# Patient Record
Sex: Female | Born: 1935 | Race: White | Hispanic: No | Marital: Married | Smoking: Former smoker
Health system: Southern US, Community
[De-identification: ages and names within clinical notes are randomized; demographics above are authoritative.]

## PROBLEM LIST (undated history)

## (undated) DIAGNOSIS — I10 Essential (primary) hypertension: Secondary | ICD-10-CM

## (undated) DIAGNOSIS — Z8489 Family history of other specified conditions: Secondary | ICD-10-CM

## (undated) DIAGNOSIS — N8189 Other female genital prolapse: Secondary | ICD-10-CM

## (undated) DIAGNOSIS — R011 Cardiac murmur, unspecified: Secondary | ICD-10-CM

## (undated) DIAGNOSIS — K219 Gastro-esophageal reflux disease without esophagitis: Secondary | ICD-10-CM

## (undated) DIAGNOSIS — F419 Anxiety disorder, unspecified: Secondary | ICD-10-CM

## (undated) HISTORY — PX: ABDOMINAL HYSTERECTOMY: SHX81

## (undated) HISTORY — PX: BACK SURGERY: SHX140

## (undated) HISTORY — PX: FOOT SURGERY: SHX648

## (undated) HISTORY — PX: TONSILLECTOMY: SUR1361

---

## 1999-08-28 ENCOUNTER — Other Ambulatory Visit: Admission: RE | Admit: 1999-08-28 | Discharge: 1999-08-28 | Payer: Self-pay | Admitting: Gynecology

## 2000-02-27 ENCOUNTER — Encounter: Payer: Self-pay | Admitting: Gynecology

## 2000-02-27 ENCOUNTER — Encounter: Admission: RE | Admit: 2000-02-27 | Discharge: 2000-02-27 | Payer: Self-pay | Admitting: Gynecology

## 2000-08-29 ENCOUNTER — Other Ambulatory Visit: Admission: RE | Admit: 2000-08-29 | Discharge: 2000-08-29 | Payer: Self-pay | Admitting: Gynecology

## 2001-09-01 ENCOUNTER — Other Ambulatory Visit: Admission: RE | Admit: 2001-09-01 | Discharge: 2001-09-01 | Payer: Self-pay | Admitting: Gynecology

## 2002-03-26 ENCOUNTER — Encounter: Admission: RE | Admit: 2002-03-26 | Discharge: 2002-03-26 | Payer: Self-pay | Admitting: Gynecology

## 2002-03-26 ENCOUNTER — Encounter: Payer: Self-pay | Admitting: Gynecology

## 2002-11-16 ENCOUNTER — Other Ambulatory Visit: Admission: RE | Admit: 2002-11-16 | Discharge: 2002-11-16 | Payer: Self-pay | Admitting: Gynecology

## 2003-03-31 ENCOUNTER — Encounter: Admission: RE | Admit: 2003-03-31 | Discharge: 2003-03-31 | Payer: Self-pay | Admitting: Gynecology

## 2003-03-31 ENCOUNTER — Encounter: Payer: Self-pay | Admitting: Gynecology

## 2003-04-15 ENCOUNTER — Encounter: Admission: RE | Admit: 2003-04-15 | Discharge: 2003-04-15 | Payer: Self-pay | Admitting: Family Medicine

## 2003-04-15 ENCOUNTER — Encounter: Payer: Self-pay | Admitting: Family Medicine

## 2003-05-05 ENCOUNTER — Encounter: Admission: RE | Admit: 2003-05-05 | Discharge: 2003-06-14 | Payer: Self-pay | Admitting: Neurosurgery

## 2003-07-08 ENCOUNTER — Observation Stay (HOSPITAL_COMMUNITY): Admission: RE | Admit: 2003-07-08 | Discharge: 2003-07-09 | Payer: Self-pay | Admitting: Neurosurgery

## 2003-11-09 ENCOUNTER — Other Ambulatory Visit: Admission: RE | Admit: 2003-11-09 | Discharge: 2003-11-09 | Payer: Self-pay | Admitting: Gynecology

## 2004-05-15 ENCOUNTER — Encounter: Admission: RE | Admit: 2004-05-15 | Discharge: 2004-05-15 | Payer: Self-pay | Admitting: Gynecology

## 2004-09-14 ENCOUNTER — Inpatient Hospital Stay (HOSPITAL_COMMUNITY): Admission: RE | Admit: 2004-09-14 | Discharge: 2004-09-19 | Payer: Self-pay | Admitting: Neurosurgery

## 2004-11-21 ENCOUNTER — Other Ambulatory Visit: Admission: RE | Admit: 2004-11-21 | Discharge: 2004-11-21 | Payer: Self-pay | Admitting: Gynecology

## 2004-12-19 ENCOUNTER — Encounter: Admission: RE | Admit: 2004-12-19 | Discharge: 2004-12-27 | Payer: Self-pay | Admitting: Neurosurgery

## 2005-07-03 ENCOUNTER — Encounter: Admission: RE | Admit: 2005-07-03 | Discharge: 2005-07-03 | Payer: Self-pay | Admitting: Gynecology

## 2005-07-25 ENCOUNTER — Encounter: Admission: RE | Admit: 2005-07-25 | Discharge: 2005-07-25 | Payer: Self-pay | Admitting: Gynecology

## 2005-11-26 ENCOUNTER — Other Ambulatory Visit: Admission: RE | Admit: 2005-11-26 | Discharge: 2005-11-26 | Payer: Self-pay | Admitting: Gynecology

## 2006-07-17 ENCOUNTER — Encounter: Admission: RE | Admit: 2006-07-17 | Discharge: 2006-07-17 | Payer: Self-pay | Admitting: Gynecology

## 2006-07-24 ENCOUNTER — Encounter: Admission: RE | Admit: 2006-07-24 | Discharge: 2006-07-24 | Payer: Self-pay | Admitting: Gynecology

## 2006-12-11 ENCOUNTER — Other Ambulatory Visit: Admission: RE | Admit: 2006-12-11 | Discharge: 2006-12-11 | Payer: Self-pay | Admitting: Gynecology

## 2007-07-28 ENCOUNTER — Encounter: Admission: RE | Admit: 2007-07-28 | Discharge: 2007-07-28 | Payer: Self-pay | Admitting: Gynecology

## 2008-08-31 ENCOUNTER — Encounter: Admission: RE | Admit: 2008-08-31 | Discharge: 2008-08-31 | Payer: Self-pay | Admitting: Gynecology

## 2008-09-08 ENCOUNTER — Encounter: Admission: RE | Admit: 2008-09-08 | Discharge: 2008-09-08 | Payer: Self-pay | Admitting: Gynecology

## 2009-09-01 ENCOUNTER — Encounter: Admission: RE | Admit: 2009-09-01 | Discharge: 2009-09-01 | Payer: Self-pay | Admitting: Gynecology

## 2010-08-12 ENCOUNTER — Other Ambulatory Visit: Payer: Self-pay | Admitting: Gynecology

## 2010-08-12 DIAGNOSIS — Z1239 Encounter for other screening for malignant neoplasm of breast: Secondary | ICD-10-CM

## 2010-08-13 ENCOUNTER — Encounter: Payer: Self-pay | Admitting: Gynecology

## 2010-09-04 ENCOUNTER — Ambulatory Visit: Payer: Self-pay

## 2010-09-26 ENCOUNTER — Ambulatory Visit
Admission: RE | Admit: 2010-09-26 | Discharge: 2010-09-26 | Disposition: A | Discharge status: Home / Self Care | Payer: Medicare Other | Source: Ambulatory Visit | Attending: Gynecology | Admitting: Gynecology

## 2010-09-26 DIAGNOSIS — Z1239 Encounter for other screening for malignant neoplasm of breast: Secondary | ICD-10-CM

## 2010-11-16 ENCOUNTER — Other Ambulatory Visit: Payer: Self-pay | Admitting: Family Medicine

## 2010-11-16 DIAGNOSIS — M549 Dorsalgia, unspecified: Secondary | ICD-10-CM

## 2010-11-17 ENCOUNTER — Ambulatory Visit
Admission: RE | Admit: 2010-11-17 | Discharge: 2010-11-17 | Disposition: A | Discharge status: Home / Self Care | Payer: Medicare Other | Source: Ambulatory Visit | Attending: Family Medicine | Admitting: Family Medicine

## 2010-11-17 ENCOUNTER — Other Ambulatory Visit: Payer: Self-pay | Admitting: Family Medicine

## 2010-11-17 DIAGNOSIS — M549 Dorsalgia, unspecified: Secondary | ICD-10-CM

## 2010-11-17 DIAGNOSIS — M545 Low back pain, unspecified: Secondary | ICD-10-CM

## 2010-11-17 MED ORDER — GADOBENATE DIMEGLUMINE 529 MG/ML IV SOLN
15.0000 mL | Freq: Once | INTRAVENOUS | Status: AC | PRN
  Administered 2010-11-17: 15 mL via INTRAVENOUS

## 2010-12-08 NOTE — Discharge Summary (Signed)
[name], Misty Dodson                 ACCOUNT NO.:  [id number]   MEDICAL RECORD NO.:  [id number]          PATIENT TYPE:  INP   LOCATION:  3021                         FACILITY:  [facility]   PHYSICIAN:  Clydene Fake, M.D.  DATE OF BIRTH:  11/15/1935   DATE OF ADMISSION:  09/14/2004  DATE OF DISCHARGE:  09/19/2004                                 DISCHARGE SUMMARY   DIAGNOSES:  Lumbar spondylosis, spondylolisthesis, instability with stenosis  with prior surgery.   DISCHARGE DIAGNOSES:  Lumbar spondylosis, spondylolisthesis, instability  with stenosis with prior surgery.   PROCEDURES:  Decompression, L3, L4, L5 and S1 and posterior fusion at L3-4  and L4-5 interbody cages and superior pedicle screw fixation at L3 through  S1 posterolateral fusion.   REASON FOR ADMISSION:  The patient is a 75 year old woman with long history  of back pain and multiple previous __________ in the past.  She has had  worsening back pain and MRI and x-rays were done showing significant  spondylitic changes, spondylolisthesis, stenosis, and instability and  patient brought in for decompression procedure.   HOSPITAL COURSE:  The patient was admitted the day of surgery and underwent  procedure without complications.  Postoperatively, the patient was  transferred to the recovery room and then to the floor, doing fairly well  but had some significant back pain.  We did start to increase her activity.  PT/OT were consulted to assist with that.  Incision __________ for the first  day or so, and then that diminished.  She had mild ileus, but finally had a  change there and had a bowel movement by September 19, 2004, she was  ambulating well, decreasing incisional pain.  Incision was clean, dry and  intact.  On September 19, 2004, she is discharged home in stable condition.   DISCHARGE MEDICATIONS:  Same as hospitalization plus Percocet and Flexeril.   FOLLOWUP:  Follow up in two weeks my office.  No strenuous  activity, wear  brace.      JRH/MEDQ  D:  11/30/2004  T:  11/30/2004  Job:  [id number]

## 2010-12-08 NOTE — Op Note (Signed)
Misty Dodson, Misty Dodson                           ACCOUNT NO.:  [id number]   MEDICAL RECORD NO.:  [id number]                   PATIENT TYPE:  INP   LOCATION:  3013                                 FACILITY:  [facility]   PHYSICIAN:  Clydene Fake, M.D.               DATE OF BIRTH:  [birth year]   DATE OF PROCEDURE:  07/08/2003  DATE OF DISCHARGE:                                 OPERATIVE REPORT   PREOPERATIVE DIAGNOSIS:  Spondylosis, prior surgery, herniated nucleus  pulposus with stenosis and lateral recess stenosis and foraminal stenosis L5-  S1 with left sided L5 radiculopathy.   POSTOPERATIVE DIAGNOSIS:  Spondylosis, prior surgery, herniated nucleus  pulposus with stenosis and lateral recess stenosis and foraminal stenosis L5-  S1 with left sided L5 radiculopathy.   PROCEDURE:  Redo left L5-S1 decompressive laminectomy and extensive  foraminotomy, microdissection with the microscope.   SURGEON:  Clydene Fake, M.D.   ASSISTANT:  Hilda Lias, M.D.   ANESTHESIA:  General endotracheal anesthesia.   ESTIMATED BLOOD LOSS:  Minimal.   DRAINS:  None.   COMPLICATIONS:  None.   REASON FOR PROCEDURE:  The patient is a 75 year old woman who has been  having pain in her back and the left hip and leg with some numbness and  weakness of the left foot corresponding with the L5 root.  MRI was done  showing prior surgery but severe spondylosis at 4-5 and 5-1 with left 5-1  lateral recess stenosis, foraminal narrowing, and possible disc herniation.  The patient is brought in for decompression.   PROCEDURE IN DETAIL:  The patient was brought to the operating room, general  anesthesia was induced, the patient was placed in the prone position on the  Wilson frame with all pressure points padded.  The patient was prepped and  draped in the usual sterile fashion.  The incision was injected with 10 mL  of 1% lidocaine with epinephrine.  An incision was then made at the site of  the previous  scar in the lower lumbar spine, the incision was taken down to  the fascia, hemostasis was obtained with Bovie cauterization.  The fascia  was incised and subperiosteal dissection was done over the spinous  processes.  There was a lot of scar tissue.  A marker was placed and x-ray  was obtained and this was pointed to over the sacrum.  We dissected shortly  cephalad, again through significant scar tissue, and placed a marker there  and took another x-ray and showed this was the 5-1 interspace.  A self-  retaining retractor was placed.  We carefully dissected through scar tissue  and we were able to dissect it off the lamina above and below and remove  some and also peel it off the dura for a bit and remove it and we cut some  more with a knife  just thinning it, leaving some attached to  the dura.  This way, we could get into that space.  2 and 2.5 Kerrison punches were  then used to remove lamina of S1.  We started removing some of the facet and  removed some of the lamina of L5.  We continued dissecting through the scar,  we were able to find the S1 nerve root and the dura as we went cephalad.  A  large hypertrophic facet with the ligament was pushing into the dura.  A  high speed drill was used to start drilling off and performing a medial  facetectomy.  During all this, the microscope was brought in for  microdissection.  We then protected the dura and drilled down the bone  removing that with the Kerrison punches.  In this way, we went down and  found the foramen at 5-1, found some hypertrophic ligament and this was  removed.  We actually found the nerve root and then while protecting the  nerve root, we were able to continue removing hypertrophic facet and  ligament that was compressing the root.  We dissected up to the 5 pedicle  and decompressed the lateral recess and the 5 root.  Attention was then  taken back to the 5-1 disc space where most of the scar and some osteophytes  and  small disc protrusions and a piece of herniation that was there.  The  disc space was extremely tight.  We could not get a pituitary into it and  did not open it up anymore leaving it intact.  We had good decompression of  the S1 root and the L5 root.  The wound was irrigated with antibiotic  solution, retractors were removed, and the fascia was closed with 0 Vicryl  interrupted suture.  The subcutaneous tissue was closed with 2-0 and 3-0  Vicryl interrupted sutures.  The skin was closed with Benzoin and Steri-  Strips.  A dressing was placed.  The patient was placed back in the supine  position, awakened from anesthesia and transferred to the recovery room in  stable condition.                                               Clydene Fake, M.D.    JRH/MEDQ  D:  07/08/2003  T:  07/08/2003  Job:  [id number]

## 2010-12-08 NOTE — Op Note (Signed)
Misty Dodson, Misty Dodson                 ACCOUNT NO.:  [id number]   MEDICAL RECORD NO.:  [id number]          PATIENT TYPE:  INP   LOCATION:  2859                         FACILITY:  [facility]   PHYSICIAN:  Clydene Fake, M.D.  DATE OF BIRTH:  [birth year]   DATE OF PROCEDURE:  09/14/2004  DATE OF DISCHARGE:                                 OPERATIVE REPORT   PREOPERATIVE DIAGNOSIS:  Lumbar spondylosis, spondylolisthesis, instability,  and stenosis with prior surgery.   POSTOPERATIVE DIAGNOSIS:  Lumbar spondylosis, spondylolisthesis,  instability, and stenosis with prior surgery.   PROCEDURE:  Redo decompressive lumbar laminectomy L3, L4, L5, and S1;  posterior lumbar interbody fusion at L3-L4 and L4-L5, with Saber interbody  cages at L3-L4 and L4-L5, segmented pedicle screw fixation with Expedium  screws from L3 through S1, L3 through S1 posterolateral fusion, autograft  from same incision, allograft, Symphony system.   SURGEON:  Clydene Fake, M.D.   ASSISTANT:  Stefani Dama, M.D.   ANESTHESIA:  General endotracheal tube anesthesia.   ESTIMATED BLOOD LOSS:  600 mL.   BLOOD REPLACED:  2 units Cell Saver given.   DRAINS:  None.   COMPLICATIONS:  None.   INDICATIONS FOR PROCEDURE:  The patient is a 75 year old woman who has had a  long term history of back pain, she has had multiple previous decompressive  laminectomies which will last a few months and then states she has constant  back pain, worse with activity, and has been progressively worsening and  hampering her activities.  MRI and x-rays of the lumbar spine were evaluated  showing severe degenerative disc disease changes at L3-L4, L4-L5, and L5-S1  with spondylolisthesis at 3-4 that does not move between flexion and  extension, also with lateral recess stenosis and foraminal stenosis.  The  patient is brought in for decompression and fusion of the lumbar spine.   PROCEDURE IN DETAIL:  The patient was brought to the  operating room, general  anesthesia was induced, the patient was placed in the prone position on the  Wilson frame with all pressure points padded.  The patient was prepped and  draped in the usual sterile fashion.  The site of the incision was injected  with 20 mL of 1% lidocaine with epinephrine.  An incision was made at the  site of previous surgery in the midline lower lumbar spine and the incision  extended cephalad.  The incision was taken down to the fascia.  Hemostasis  was obtained with Bovie cauterization.  Subperiosteal dissection was done  out the spinous process of the lamina up to the facets and we exposed the  sacrum of L5, L4, L3, after imaging with fluoroscopy, which confirmed our  position, and exposed the L2 lamina so that we could then expose out  laterally the transverse process of L3, L4, L5, and lateral sacrum.  Self-  retaining retractors were placed after we exposed this.  The laminectomy was  then performed, Leksell rongeurs removing the L3, L4, L5, and S1 spinous  processes and dissecting down to the lamina.  Using [employer], [employer]  punches, and a high speed drill, we then continued the laminectomy.  There  was a significant scar on the left side at L4-L5 and L5-S1 and we had to  carefully dissect through.  We continued with this decompression.  At L3-L4,  there was significant facet hypertrophy with significant arthritic change  causing stenosis and facetectomy was performed and a laminectomy was  performed at the L3-L4 level and the L4-L5 levels.  We dissected out  laterally into the facets at 5-1, especially on the left side, to get her on  the scar, continued this decompression.  When we were finished, we had good  central decompression of the central canal, all the nerve roots were able to  be followed out their foramen and were decompressed from L3, L4, L5, and S1.  We explored the disc spaces at L5-S1 and we were able to get into the disc  space on the  left side after working through some of the scar and removing  some of the osteophytes.  On the right side, disc space appeared fused over,  it was very spondylitic and we were not able to get into the disc space.  No  interbody fusion was done there, we just made sure we had a good  decompression.  The L4-L5 disc space was found bilaterally, we carefully  worked through the scar on the left side and the disc space was incised and  discectomy performed with pituitary rongeurs and curets.  We used interbody  broaches to continue distracting the interspace up to 9 mm and then  preparing the interbody for fusion and cages using the various broaches.  We  used the curets to remove cartilaginous endplates and pituitary rongeurs to  remove the tissue.  Once we had a good discectomy performed, all the bone  that was removed during the laminectomy was cleaned from its soft tissue,  chopped up into small pieces and placed the Symphony system.  This bone was  then packed into two Saber interbody cages that were 9 height by 9 wide.  We  then packed this bone into the interbody space and while holding distraction  at one side, tapped the cage into the interspace on the opposite side being  very careful of the nerve roots and dura.  We removed the distraction and  then put the second cage in after packing the left side with the autograft  bone, also.  We then explored nerve roots and made sure we had decompression  and good position of the cages.  Attention was turned to 3-4 and, again,  discectomy was performed bilaterally, the disc space was distracted up to 11  mm and the interbody space was prepared with the broaches for interbody  fusion.  We used curets to use the cartilaginous endplates and packed two 11  high by 9 wide Saber cages with the autograft bone placed in the interspace and then tapped the cage on each side, again exploring the nerve roots  making sure we had good decompression and good  position of the cages.  Te  wound was irrigated with antibiotic solution and attention was turned to the  posterolateral gutters where a high speed drill was used to decorticate the  transverse processes and lateral facets of L3, L4, L5, and S1.  Under  fluoroscopic imaging, we found the pedicle entry points for L3, used the  high speed drill to decorticate and placed the pedicle probe down the  pedicle, tapped the pedicle,  used a small probe to  make sure we had good  bony circumference, and then placed a pedicle screw.  This process was  repeated first on the right side at L3, L4, L5, and S1, and then at all four  levels on the left side.  50 mm screws were placed at L3 and L4, 45 mm  screws were placed at L5, a 40 mm screw was placed on the right side at S1  and a 35 by 7 mm screw placed at the left S1.  All the other screws were 6  mm wide.  The wound was irrigated with antibiotic solution.  The autograft  Symphony mixture was mixed with the allograft Symphony mixture and all this  bone was packed in the posterolateral gutters from L3 to S1 for  posterolateral fusion.  Two rods were placed on the right and left and  locking nuts placed and then these were tightened down.  Final AP and  lateral fluoroscopic images were obtained showing good position of the  pedicle screws, rods, interbody cages, and good alignment of the spine.  The  retractors were removed.  Hemostasis was obtained with Bovie cauterization.  Gelfoam was placed over the dura and then the paraspinous muscles were  closed with 0 Vicryl interrupted sutures, the fascia was closed with 0  Vicryl interrupted sutures, the subcutaneous tissue closed with 0, 2-0 and 3-  0 Vicryl interrupted sutures, and the skin was closed with Benzoin and Steri-  Strips.  A dressing was placed.  The patient was placed back in the supine  position, awakened from anesthesia and transferred to the recovery room in  stable condition.    JRH/MEDQ   D:  09/14/2004  T:  09/14/2004  Job:  [id number]

## 2011-09-28 ENCOUNTER — Other Ambulatory Visit: Payer: Self-pay | Admitting: Gynecology

## 2011-09-28 DIAGNOSIS — Z1231 Encounter for screening mammogram for malignant neoplasm of breast: Secondary | ICD-10-CM

## 2011-10-11 ENCOUNTER — Ambulatory Visit
Admission: RE | Admit: 2011-10-11 | Discharge: 2011-10-11 | Disposition: A | Discharge status: Home / Self Care | Payer: Medicare Other | Source: Ambulatory Visit | Attending: Gynecology | Admitting: Gynecology

## 2011-10-11 DIAGNOSIS — Z1231 Encounter for screening mammogram for malignant neoplasm of breast: Secondary | ICD-10-CM

## 2012-02-21 ENCOUNTER — Encounter (HOSPITAL_COMMUNITY): Payer: Self-pay | Admitting: *Deleted

## 2012-02-21 ENCOUNTER — Emergency Department (HOSPITAL_COMMUNITY): Payer: Medicare Other

## 2012-02-21 ENCOUNTER — Emergency Department (HOSPITAL_COMMUNITY)
Admission: EM | Admit: 2012-02-21 | Discharge: 2012-02-21 | Disposition: A | Discharge status: Home / Self Care | Payer: Medicare Other | Attending: Emergency Medicine | Admitting: Emergency Medicine

## 2012-02-21 DIAGNOSIS — R0789 Other chest pain: Secondary | ICD-10-CM

## 2012-02-21 LAB — CBC
Hemoglobin: 14.4 g/dL (ref 12.0–15.0)
MCH: 32 pg (ref 26.0–34.0)
MCHC: 33.9 g/dL (ref 30.0–36.0)
MCV: 94.4 fL (ref 78.0–100.0)

## 2012-02-21 LAB — COMPREHENSIVE METABOLIC PANEL
ALT: 22 U/L (ref 0–35)
Calcium: 10 mg/dL (ref 8.4–10.5)
Creatinine, Ser: 0.66 mg/dL (ref 0.50–1.10)
GFR calc Af Amer: >90 mL/min (ref >90)
GFR calc non Af Amer: 86 mL/min — ABNORMAL LOW (ref >90)
Glucose, Bld: 93 mg/dL (ref 70–99)
Sodium: 143 mEq/L (ref 135–145)
Total Protein: 7 g/dL (ref 6.0–8.3)

## 2012-02-21 LAB — POCT I-STAT TROPONIN I
Troponin i, poc: 0 ng/mL (ref 0.00–0.08)
Troponin i, poc: 0 ng/mL (ref 0.00–0.08)

## 2012-02-21 NOTE — ED Notes (Signed)
Family at bedside.husband

## 2012-02-21 NOTE — ED Provider Notes (Signed)
History     CSN: [id number]  Arrival date & time 02/21/12  1344   First MD Initiated Contact with Patient 02/21/12 1437      Chief Complaint  Patient presents with  . Chest Pain    (Consider location/radiation/quality/duration/timing/severity/associated sxs/prior treatment) Patient is a 75 y.o. female presenting with chest pain. The history is provided by the patient.  Chest Pain The chest pain began less than 1 hour ago. Duration of episode(s) is 45 minutes. Chest pain occurs rarely. The chest pain is resolved. The severity of the pain is moderate. The quality of the pain is described as aching and pressure-like. Pertinent negatives for primary symptoms include no fever and no cough.  Pertinent negatives for associated symptoms include no diaphoresis. She tried aspirin for the symptoms. Risk factors include being elderly.   Patient had atypical chest pain (epigastric pain). Now pain free, she does have recent travel to [place] but denies SOB or pleuritic chest pain. No hx of DVT, PE.   History reviewed. No pertinent past medical history. HTN, back surgeries  History reviewed. No pertinent past surgical history.  History reviewed. No pertinent family history.  History  Substance Use Topics  . Smoking status: Not on file  . Smokeless tobacco: Not on file  . Alcohol Use: No    OB History    Grav Para Term Preterm Abortions TAB SAB Ect Mult Living                  Review of Systems  Constitutional: Negative for fever and diaphoresis.  Respiratory: Negative for cough.   Cardiovascular: Positive for chest pain.  All other systems reviewed and are negative.    Allergies  Review of patient's allergies indicates no known allergies.  Home Medications   Current Outpatient Rx  Name Route Sig Dispense Refill  . ACETAMINOPHEN 500 MG PO TABS Oral Take 500 mg by mouth every 6 (six) hours as needed. For pain/headache    . GABAPENTIN 300 MG PO CAPS Oral Take 300 mg by mouth 2  (two) times daily.    Marland Kitchen PROPRANOLOL HCL 10 MG PO TABS Oral Take 10 mg by mouth daily.      BP 140/77  Pulse 62  Resp 16  Ht 5\' 7"  (1.702 m)  Wt 130 lb (58.968 kg)  BMI 20.36 kg/m2  SpO2 95%  Physical Exam  Constitutional: She is oriented to person, place, and time. She appears well-developed.  Eyes: Conjunctivae and EOM are normal. Pupils are equal, round, and reactive to light.  Neck: Normal range of motion. Neck supple.  Cardiovascular: Normal rate, regular rhythm, normal heart sounds and intact distal pulses.   Pulmonary/Chest: Effort normal and breath sounds normal.  Abdominal: Soft. Bowel sounds are normal.  Neurological: She is alert and oriented to person, place, and time. She has normal reflexes.  Skin: Skin is warm.  Psychiatric: She has a normal mood and affect. Her behavior is normal. Judgment and thought content normal.    ED Course  Procedures (including critical care time)    Labs Reviewed  CBC  COMPREHENSIVE METABOLIC PANEL   No results found.   No diagnosis found.   Date: 02/21/2012  Rate: 64  Rhythm: normal sinus rhythm  QRS Axis: normal  Intervals: normal  ST/T Wave abnormalities: ST depression anteriorly  Conduction Disutrbances:none  Narrative Interpretation:   Old EKG Reviewed: changes noted     MDM  76 yo F with atypical chest pain that resolved. Will get  2 sets of troponins. Called CDU PA Abigail and will transfer to CDU if first set negative. If pain free and 2 sets negative, can be dc with f/u outpatient.         Richardean Canal, MD 02/21/12 [phone]

## 2012-02-21 NOTE — ED Notes (Signed)
Per EMS pt. Has acute onset of chest pain, dizziness, and nausea while riding in a car for 20 minutes.  Symptoms have completely subsided.  Pt. CBG was 125 and pt. Had no complaints of pain during EMS transport.

## 2012-02-21 NOTE — ED Provider Notes (Signed)
No pain on arrival to CDU. She is waiting on 2nd set of enzymes (POCTroponin) due at 7:00.   Second trop negative. Patient has no further pain. She can be discharged home.   Rodena Medin, PA-C 03/07/12 1433

## 2012-03-07 NOTE — ED Provider Notes (Signed)
Medical screening examination/treatment/procedure(s) were performed by non-physician practitioner and as supervising physician I was immediately available for consultation/collaboration.   Makayli Bracken, MD 03/07/12 1521 

## 2012-11-24 ENCOUNTER — Other Ambulatory Visit: Payer: Self-pay

## 2013-01-28 ENCOUNTER — Ambulatory Visit (HOSPITAL_COMMUNITY)
Admission: RE | Admit: 2013-01-28 | Discharge: 2013-01-28 | Disposition: A | Discharge status: Home / Self Care | Payer: Medicare Other | Source: Ambulatory Visit | Attending: Family Medicine | Admitting: Family Medicine

## 2013-01-28 ENCOUNTER — Other Ambulatory Visit (HOSPITAL_COMMUNITY): Payer: Self-pay | Admitting: Family Medicine

## 2013-01-28 DIAGNOSIS — M25561 Pain in right knee: Secondary | ICD-10-CM

## 2013-01-28 DIAGNOSIS — M79609 Pain in unspecified limb: Secondary | ICD-10-CM | POA: Insufficient documentation

## 2013-01-28 DIAGNOSIS — M7989 Other specified soft tissue disorders: Secondary | ICD-10-CM

## 2013-01-28 NOTE — Progress Notes (Signed)
VASCULAR LAB PRELIMINARY  PRELIMINARY  PRELIMINARY  PRELIMINARY  Right lower extremity venous duplex completed.    Preliminary report:  Right:  No evidence of DVT, superficial thrombosis, or Baker's cyst.  Jarmel Linhardt, RVS 01/28/2013, 7:02 PM

## 2013-02-05 ENCOUNTER — Ambulatory Visit: Payer: Medicare Other | Admitting: Women's Health

## 2013-03-26 ENCOUNTER — Other Ambulatory Visit: Payer: Self-pay

## 2013-03-26 DIAGNOSIS — Z1231 Encounter for screening mammogram for malignant neoplasm of breast: Secondary | ICD-10-CM

## 2013-04-22 ENCOUNTER — Ambulatory Visit
Admission: RE | Admit: 2013-04-22 | Discharge: 2013-04-22 | Disposition: A | Discharge status: Home / Self Care | Payer: Medicare Other | Source: Ambulatory Visit

## 2013-04-22 DIAGNOSIS — Z1231 Encounter for screening mammogram for malignant neoplasm of breast: Secondary | ICD-10-CM

## 2014-01-05 ENCOUNTER — Other Ambulatory Visit: Payer: Self-pay | Admitting: Urology

## 2014-02-08 ENCOUNTER — Encounter (HOSPITAL_COMMUNITY): Payer: Self-pay | Admitting: Pharmacy Technician

## 2014-02-12 ENCOUNTER — Encounter (HOSPITAL_COMMUNITY)
Admission: RE | Admit: 2014-02-12 | Discharge: 2014-02-12 | Disposition: A | Discharge status: Home / Self Care | Payer: Medicare Other | Source: Ambulatory Visit | Attending: Obstetrics and Gynecology | Admitting: Obstetrics and Gynecology

## 2014-02-12 ENCOUNTER — Encounter (HOSPITAL_COMMUNITY): Payer: Self-pay

## 2014-02-12 DIAGNOSIS — Z01812 Encounter for preprocedural laboratory examination: Secondary | ICD-10-CM | POA: Diagnosis present

## 2014-02-12 DIAGNOSIS — Z01818 Encounter for other preprocedural examination: Secondary | ICD-10-CM | POA: Diagnosis not present

## 2014-02-12 HISTORY — DX: Essential (primary) hypertension: I10

## 2014-02-12 HISTORY — DX: Anxiety disorder, unspecified: F41.9

## 2014-02-12 HISTORY — DX: Family history of other specified conditions: Z84.89

## 2014-02-12 HISTORY — DX: Cardiac murmur, unspecified: R01.1

## 2014-02-12 HISTORY — DX: Gastro-esophageal reflux disease without esophagitis: K21.9

## 2014-02-12 LAB — TYPE AND SCREEN
ABO/RH(D): A POS
Antibody Screen: NEGATIVE

## 2014-02-12 LAB — ABO/RH: ABO/RH(D): A POS

## 2014-02-12 LAB — APTT: APTT: 24 s (ref 24–37)

## 2014-02-12 LAB — CBC
HCT: 40.9 % (ref 36.0–46.0)
HEMOGLOBIN: 13.9 g/dL (ref 12.0–15.0)
MCH: 32.5 pg (ref 26.0–34.0)
MCHC: 34 g/dL (ref 30.0–36.0)
MCV: 95.6 fL (ref 78.0–100.0)
Platelets: 161 10*3/uL (ref 150–400)
RBC: 4.28 MIL/uL (ref 3.87–5.11)
RDW: 13 % (ref 11.5–15.5)
WBC: 5.3 10*3/uL (ref 4.0–10.5)

## 2014-02-12 LAB — PROTIME-INR
INR: 0.94 (ref 0.00–1.49)
Prothrombin Time: 12.6 seconds (ref 11.6–15.2)

## 2014-02-12 NOTE — Pre-Procedure Instructions (Signed)
Previous EKG and today's (02/12/14) reviewed by Dr Rodman Pickleassidy. No orders given.

## 2014-02-12 NOTE — Patient Instructions (Signed)
[address] [name]  02/12/2014   Your procedure is scheduled on:  02/23/14  Enter through the Main Entrance of [facility] at 6 AM.  Pick up the phone at the desk and dial [phone].   Call this number if you have problems the morning of surgery: [phone]   Remember:   Do not eat food:After Midnight.  Do not drink clear liquids: After Midnight.  Take these medicines the morning of surgery with A SIP OF WATER: Inderal, Nexium   Do not wear jewelry, make-up or nail polish.  Do not wear lotions, powders, or perfumes. You may wear deodorant.  Do not shave 48 hours prior to surgery.  Do not bring valuables to the hospital.  [facility] is not   responsible for any belongings or valuables brought to the hospital.  Contacts, dentures or bridgework may not be worn into surgery.  Leave suitcase in the car. After surgery it may be brought to your room.  For patients admitted to the hospital, checkout time is 11:00 AM the day of              discharge.   Patients discharged the day of surgery will not be allowed to drive             home.  Name and phone number of your driver: NA  Special Instructions:      Please read over the following fact sheets that you were given:   Surgical Site Infection Prevention

## 2014-02-22 ENCOUNTER — Encounter (HOSPITAL_COMMUNITY): Payer: Self-pay | Admitting: Obstetrics and Gynecology

## 2014-02-22 DIAGNOSIS — N8189 Other female genital prolapse: Secondary | ICD-10-CM

## 2014-02-22 HISTORY — DX: Other female genital prolapse: N81.89

## 2014-02-22 MED ORDER — PHENAZOPYRIDINE HCL 200 MG PO TABS
200.0000 mg | ORAL_TABLET | Freq: Once | ORAL | Status: AC
  Administered 2014-02-23: 200 mg via ORAL
  Filled 2014-02-22: qty 1

## 2014-02-22 MED ORDER — PHENAZOPYRIDINE HCL 200 MG PO TABS
200.0000 mg | ORAL_TABLET | Freq: Once | ORAL | Status: DC
  Filled 2014-02-22: qty 1

## 2014-02-22 NOTE — H&P (Signed)
Misty Dodson is an 78 y.o. female G3P3 with pelvic floor relaxation and some SUI.  Pt states more pressure as day goes on or with increased activity.  Rarely sexually active.  When tissue is relaxed, vaginal tissue is irritated.    Pertinent Gynecological History: Menopause (TVH 1990's) DES exposure: unknown  Sexually transmitted diseases: no past history Previous GYN Procedures: TVH  No abnormal pap OB History: G3, P3003, G3 breech 9#5; TSVD x3  Menstrual History: No LMP recorded. Patient is postmenopausal.    Past Medical History  Diagnosis Date  . Family history of anesthesia complication     son's "heart stopped" during knee repair age [age]Yrs  . GERD (gastroesophageal reflux disease)   . Hypertension   . Heart murmur   . Anxiety     occasional "panic attacks"  . 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  migraines, back pain  Past Surgical History  Procedure Laterality Date  . Abdominal hysterectomy    . Tonsillectomy    . Foot surgery    . Back surgery      "several times"    FH: seizure disorder  Social History:  reports that she has quit smoking. She does not have any smokeless tobacco history on file. She reports that she drinks alcohol. She reports that she does not use illicit drugs.married, retired - travels!  Allergies: No Known Allergies    Celebrex, tylenol, nexium, melatonin, neurontin, premarin cream, propranolol, ambien, zyrtec  Review of Systems  Constitutional: Negative.   Eyes: Negative.   Respiratory: Negative.   Cardiovascular: Negative.   Gastrointestinal: Negative.   Genitourinary:       Incontinence  Musculoskeletal: Negative.   Skin: Negative.   Neurological: Negative.   Psychiatric/Behavioral: Negative.     There were no vitals taken for this visit. Physical Exam  Constitutional: She is oriented to person, place, and time. She appears well-developed and well-nourished.  HENT:  Head: Normocephalic and atraumatic.  Cardiovascular: Normal  rate and regular rhythm.   Respiratory: Effort normal and breath sounds normal. No respiratory distress. She has no wheezes.  GI: Soft. Bowel sounds are normal. She exhibits no distension. There is no tenderness.  Musculoskeletal: Normal range of motion.  Neurological: She is alert and oriented to person, place, and time.  Skin: Skin is warm and dry.  Psychiatric: She has a normal mood and affect. Her behavior is normal.    No results found for this or any previous visit (from the past 24 hour(s)).  No results found.  Assessment/Plan: 78yo G3P3 with pelvic relaxation and SUI for A&P repair and sling, with urology D/w pt r/b/a wishes to proceed Ancef for prophylaxis PCA for pain postoperatively  [name], Elsia [place] 02/22/2014, 4:32 PM

## 2014-02-22 NOTE — H&P (Signed)
History of Present Illness   Misty Dodson normally does not wear a pad but can leak with exercise but not with coughing, sneezing, or with urgency. She does splinting for urination as noted. Dr Ellyn Hack is planning and A&P repair and discussion regarding a pessary had been discussed in the past. She has a grade 3 cystocele with central defect and her vaginal cuff descends 4 or 5 cm. She had a grade 2 rectocele. She had a negative cough test with the cystocele reduced. We called Dr Emeline Darling office and clarified that she is planning to do the A&P repair as noted in her medical record and this was to be clarified on this visit to Misty Rohrman  Importantly, when she did have a pessary, she was wearing 2 thick pads a day which were quite wet with walking.  She did not void and was catheterized for 275 mL. Her initial residual was 0.0 mL. Her maximum capacity is 395 mL. Her bladder was somewhat hypersensitive. Her bladder was stable. Because of Vagovagal response, she was not filled to a greater amount. She did not leak with a Valsalva pressure of 132 cmH2O and this is reproducible. This is with and without the prolapse reduced. During voluntary voiding, she voided 100 mL with a maximum flow of 7 mL/sec. Maximum voiding pressure was approximately 23 cmH2O. Residual was 300 mL. EMG activity increased during the voiding phase as noted. She had an interrupted flow pattern. She has spinal hardware as noted with a moderate cystocele fluoroscopically Bladder neck descended approximately 2 cm. Bladder was trabeculated. The details of the urodynamics are signed and dictated on the urodynamic sheet.   Review of Systems: No change in bowel or neurologic systems.    Past Medical History Problems  1. History of High cholesterol (272.0) 2. History of arthritis (V13.4) 3. History of esophageal reflux (V12.79)  Surgical History Problems  1. History of Back Surgery 2. History of Hysterectomy 3. History of  Tonsillectomy  Current Meds 1. CeleBREX 200 MG Oral Capsule;  Therapy: (Recorded:10Jun2015) to Recorded 2. Ciprofloxacin HCl - 250 MG Oral Tablet; Take 1 tablet twice daily;  Therapy: 10Jun2015 to (Evaluate:17Jun2015); Last Rx:10Jun2015 Ordered 3. Gabapentin 300 MG TABS;  Therapy: (Recorded:10Jun2015) to Recorded 4. NexIUM 20 MG Oral Capsule Delayed Release;  Therapy: (Recorded:10Jun2015) to Recorded 5. Oxycodone-Acetaminophen 5-325 MG Oral Tablet;  Therapy: (Recorded:10Jun2015) to Recorded 6. Tylenol 500 MG CAPS;  Therapy: (Recorded:10Jun2015) to Recorded 7. Zyrtec 10 MG TABS;  Therapy: (Recorded:10Jun2015) to Recorded  Allergies Medication  1. No Known Drug Allergies  Family History Problems  1. Family history of Medical history non-contributory  Social History Problems  1. Alcohol use (V49.89)   1 per day 2. Former smoker (V15.82)   1 pack a day for 15 years, quit date not provided only stated "many". 3. Married 4. Retired  Assessment Assessed  1. Cystocele, midline (618.01) 2. Rectocele, female (618.04) 3. Female stress incontinence (625.6)  Discussion/Summary   A picture was drawn. Because she can leak with exercise, she likely has a mild outlet abnormality. She also reports leaking with walking with a pessary in place, so one can have a false positive ____ with a pessary.  I talked about pelvic floor exercises versus watchful waiting versus physical therapy versus a sling.   We talked about a sling in detail. Pros, cons, general surgical and anesthetic risks, and other options including behavioral therapy and watchful waiting were discussed. She understands that slings are generally successful in 90% of cases  for stress incontinence, 50% for urge incontinence, and that in a small percentage of cases the incontinence can worsen. The risk of persistent, de novo, or worsening incontinence/dysfunction was discussed. Risks were described but not limited to the  discussion of injury to neighboring structures including the bowel (with possible life-threatening sepsis and colostomy), bladder, urethra, vagina (all resulting in further surgery), and ureter (resulting in re-implantation). We also talked about the risk of retention requiring urethrolysis, extrusion requiring revision, and erosion resulting in further surgery. Bleeding risks and transfusion rates and the risk of infection were discussed. The risk of pelvic and abdominal pain syndromes, dyspareunia, and neuropathies were discussed. The need for CIC was described as well as the usual postoperative course. The patient understands that she might not reach her treatment goal and that she might be worse following surgery. Mesh TV issues were discussed.  I felt that watchful waiting was something to strongly consider.   Sling issues were discussed on TV.   Mr and Mrs Shellee MiloDrag and I did talk a lot about watchful waiting versus sling. Misty Shellee MiloDrag said she really wants everything done at the same time and I think she made a good decision for her to have a sling. She asked me what I would do and I relayed this, which was a more conservative approach.   She will call Dr Ellyn HackBovard. I will wait to hear from them. We will proceed accordingly with a sling. Bladder lift was discussed.  After a thorough review of the management options for the patient's condition the patient  elected to proceed with surgical therapy as noted above. We have discussed the potential benefits and risks of the procedure, side effects of the proposed treatment, the likelihood of the patient achieving the goals of the procedure, and any potential problems that might occur during the procedure or recuperation. Informed consent has been obtained.

## 2014-02-23 ENCOUNTER — Encounter (HOSPITAL_COMMUNITY): Payer: Self-pay | Admitting: Anesthesiology

## 2014-02-23 ENCOUNTER — Encounter (HOSPITAL_COMMUNITY)
Admission: RE | Disposition: A | Discharge status: Home / Self Care | Payer: Self-pay | Source: Ambulatory Visit | Attending: Obstetrics and Gynecology

## 2014-02-23 ENCOUNTER — Observation Stay (HOSPITAL_COMMUNITY)
Admission: RE | Admit: 2014-02-23 | Discharge: 2014-02-24 | Disposition: A | Discharge status: Home / Self Care | Payer: Medicare Other | Source: Ambulatory Visit | Attending: Obstetrics and Gynecology | Admitting: Obstetrics and Gynecology

## 2014-02-23 ENCOUNTER — Ambulatory Visit (HOSPITAL_COMMUNITY): Payer: Medicare Other | Admitting: Anesthesiology

## 2014-02-23 ENCOUNTER — Encounter (HOSPITAL_COMMUNITY): Payer: Medicare Other | Admitting: Anesthesiology

## 2014-02-23 DIAGNOSIS — Z87891 Personal history of nicotine dependence: Secondary | ICD-10-CM | POA: Diagnosis not present

## 2014-02-23 DIAGNOSIS — R011 Cardiac murmur, unspecified: Secondary | ICD-10-CM | POA: Diagnosis not present

## 2014-02-23 DIAGNOSIS — N393 Stress incontinence (female) (male): Secondary | ICD-10-CM | POA: Diagnosis not present

## 2014-02-23 DIAGNOSIS — I10 Essential (primary) hypertension: Secondary | ICD-10-CM | POA: Insufficient documentation

## 2014-02-23 DIAGNOSIS — K219 Gastro-esophageal reflux disease without esophagitis: Secondary | ICD-10-CM | POA: Insufficient documentation

## 2014-02-23 DIAGNOSIS — N993 Prolapse of vaginal vault after hysterectomy: Secondary | ICD-10-CM | POA: Diagnosis present

## 2014-02-23 DIAGNOSIS — F411 Generalized anxiety disorder: Secondary | ICD-10-CM | POA: Insufficient documentation

## 2014-02-23 DIAGNOSIS — N8189 Other female genital prolapse: Secondary | ICD-10-CM | POA: Diagnosis present

## 2014-02-23 HISTORY — DX: Other female genital prolapse: N81.89

## 2014-02-23 HISTORY — PX: ANTERIOR AND POSTERIOR REPAIR: SHX5121

## 2014-02-23 HISTORY — PX: PUBOVAGINAL SLING: SHX1035

## 2014-02-23 HISTORY — PX: CYSTOSCOPY: SHX5120

## 2014-02-23 LAB — TYPE AND SCREEN
ABO/RH(D): A POS
Antibody Screen: NEGATIVE

## 2014-02-23 LAB — COMPREHENSIVE METABOLIC PANEL
ALBUMIN: 3.8 g/dL (ref 3.5–5.2)
ALT: 20 U/L (ref 0–35)
AST: 24 U/L (ref 0–37)
Alkaline Phosphatase: 66 U/L (ref 39–117)
Anion gap: 11 (ref 5–15)
BILIRUBIN TOTAL: 0.2 mg/dL — AB (ref 0.3–1.2)
BUN: 24 mg/dL — ABNORMAL HIGH (ref 6–23)
CHLORIDE: 103 meq/L (ref 96–112)
CO2: 26 mEq/L (ref 19–32)
CREATININE: 0.67 mg/dL (ref 0.50–1.10)
Calcium: 9.3 mg/dL (ref 8.4–10.5)
GFR calc Af Amer: >90 mL/min (ref >90)
GFR calc non Af Amer: 84 mL/min — ABNORMAL LOW (ref >90)
Glucose, Bld: 106 mg/dL — ABNORMAL HIGH (ref 70–99)
Potassium: 3.9 mEq/L (ref 3.7–5.3)
Sodium: 140 mEq/L (ref 137–147)
TOTAL PROTEIN: 6.7 g/dL (ref 6.0–8.3)

## 2014-02-23 SURGERY — ANTERIOR (CYSTOCELE) AND POSTERIOR REPAIR (RECTOCELE)
Anesthesia: Monitor Anesthesia Care | Site: Vagina

## 2014-02-23 MED ORDER — LIDOCAINE HCL (CARDIAC) 20 MG/ML IV SOLN
INTRAVENOUS | Status: AC
  Filled 2014-02-23: qty 5

## 2014-02-23 MED ORDER — MIDAZOLAM HCL 2 MG/2ML IJ SOLN: 0.5000 mg | Freq: Once | INTRAMUSCULAR | Status: DC | PRN

## 2014-02-23 MED ORDER — CEFAZOLIN SODIUM-DEXTROSE 2-3 GM-% IV SOLR
INTRAVENOUS | Status: AC
  Filled 2014-02-23: qty 50

## 2014-02-23 MED ORDER — GUAIFENESIN 100 MG/5ML PO SOLN: 15.0000 mL | ORAL | Status: DC | PRN

## 2014-02-23 MED ORDER — FENTANYL CITRATE 0.05 MG/ML IJ SOLN
INTRAMUSCULAR | Status: AC
  Filled 2014-02-23: qty 2

## 2014-02-23 MED ORDER — LACTATED RINGERS IV SOLN
INTRAVENOUS | Status: DC
  Administered 2014-02-23 – 2014-02-24 (×3): via INTRAVENOUS

## 2014-02-23 MED ORDER — HYDROMORPHONE 0.3 MG/ML IV SOLN
INTRAVENOUS | Status: DC
  Administered 2014-02-23: 12:00:00 via INTRAVENOUS
  Administered 2014-02-23: 0.799 mg via INTRAVENOUS
  Filled 2014-02-23: qty 25

## 2014-02-23 MED ORDER — OXYCODONE-ACETAMINOPHEN 5-325 MG PO TABS
1.0000 | ORAL_TABLET | ORAL | Status: DC | PRN
  Administered 2014-02-23 (×2): 1 via ORAL
  Administered 2014-02-23 – 2014-02-24 (×3): 2 via ORAL
  Filled 2014-02-23 (×3): qty 2
  Filled 2014-02-23 (×2): qty 1

## 2014-02-23 MED ORDER — MEPERIDINE HCL 25 MG/ML IJ SOLN: 6.2500 mg | INTRAMUSCULAR | Status: DC | PRN

## 2014-02-23 MED ORDER — PROMETHAZINE HCL 25 MG/ML IJ SOLN: 6.2500 mg | INTRAMUSCULAR | Status: DC | PRN

## 2014-02-23 MED ORDER — ONDANSETRON HCL 4 MG/2ML IJ SOLN
INTRAMUSCULAR | Status: AC
  Filled 2014-02-23: qty 2

## 2014-02-23 MED ORDER — PANTOPRAZOLE SODIUM 40 MG PO TBEC: 40.0000 mg | DELAYED_RELEASE_TABLET | Freq: Every day | ORAL | Status: DC

## 2014-02-23 MED ORDER — STERILE WATER FOR IRRIGATION IR SOLN
Status: DC | PRN
  Administered 2014-02-23: 1000 mL

## 2014-02-23 MED ORDER — DEXAMETHASONE SODIUM PHOSPHATE 10 MG/ML IJ SOLN
INTRAMUSCULAR | Status: AC
  Filled 2014-02-23: qty 1

## 2014-02-23 MED ORDER — GABAPENTIN 300 MG PO CAPS
300.0000 mg | ORAL_CAPSULE | Freq: Two times a day (BID) | ORAL | Status: DC
  Administered 2014-02-23: 300 mg via ORAL
  Filled 2014-02-23 (×3): qty 1

## 2014-02-23 MED ORDER — CELECOXIB 200 MG PO CAPS
200.0000 mg | ORAL_CAPSULE | Freq: Every day | ORAL | Status: DC
  Administered 2014-02-24: 200 mg via ORAL
  Filled 2014-02-23 (×2): qty 1

## 2014-02-23 MED ORDER — KETOROLAC TROMETHAMINE 30 MG/ML IJ SOLN: 15.0000 mg | Freq: Once | INTRAMUSCULAR | Status: DC | PRN

## 2014-02-23 MED ORDER — KETOROLAC TROMETHAMINE 30 MG/ML IJ SOLN
INTRAMUSCULAR | Status: AC
  Filled 2014-02-23: qty 1

## 2014-02-23 MED ORDER — PROPOFOL 10 MG/ML IV EMUL
INTRAVENOUS | Status: AC
  Filled 2014-02-23: qty 20

## 2014-02-23 MED ORDER — ESTRADIOL 0.1 MG/GM VA CREA
TOPICAL_CREAM | VAGINAL | Status: DC | PRN
  Administered 2014-02-23: 1 via VAGINAL

## 2014-02-23 MED ORDER — BUPIVACAINE HCL (PF) 0.25 % IJ SOLN
INTRAMUSCULAR | Status: AC
  Filled 2014-02-23: qty 30

## 2014-02-23 MED ORDER — SODIUM CHLORIDE 0.9 % IR SOLN
Freq: Once | Status: AC
  Administered 2014-02-23: 10:00:00
  Filled 2014-02-23: qty 1

## 2014-02-23 MED ORDER — SODIUM CHLORIDE 0.9 % IJ SOLN: 9.0000 mL | INTRAMUSCULAR | Status: DC | PRN

## 2014-02-23 MED ORDER — PROPRANOLOL HCL 10 MG PO TABS
10.0000 mg | ORAL_TABLET | Freq: Every day | ORAL | Status: DC
  Administered 2014-02-24: 10 mg via ORAL
  Filled 2014-02-23: qty 1

## 2014-02-23 MED ORDER — IBUPROFEN 800 MG PO TABS: 800.0000 mg | ORAL_TABLET | Freq: Three times a day (TID) | ORAL | Status: DC | PRN

## 2014-02-23 MED ORDER — MIDAZOLAM HCL 2 MG/2ML IJ SOLN
INTRAMUSCULAR | Status: AC
  Filled 2014-02-23: qty 2

## 2014-02-23 MED ORDER — LIDOCAINE-EPINEPHRINE (PF) 1 %-1:200000 IJ SOLN
INTRAMUSCULAR | Status: DC | PRN
  Administered 2014-02-23: 30 mL

## 2014-02-23 MED ORDER — LIDOCAINE-EPINEPHRINE (PF) 1 %-1:200000 IJ SOLN
INTRAMUSCULAR | Status: AC
  Filled 2014-02-23: qty 10

## 2014-02-23 MED ORDER — CEFAZOLIN SODIUM-DEXTROSE 2-3 GM-% IV SOLR
2.0000 g | INTRAVENOUS | Status: AC
  Administered 2014-02-23: 2 g via INTRAVENOUS

## 2014-02-23 MED ORDER — ZOLPIDEM TARTRATE 5 MG PO TABS
5.0000 mg | ORAL_TABLET | Freq: Every evening | ORAL | Status: DC | PRN
  Administered 2014-02-23: 5 mg via ORAL
  Filled 2014-02-23: qty 1

## 2014-02-23 MED ORDER — ONDANSETRON HCL 4 MG/2ML IJ SOLN
INTRAMUSCULAR | Status: DC | PRN
  Administered 2014-02-23: 4 mg via INTRAVENOUS

## 2014-02-23 MED ORDER — 0.9 % SODIUM CHLORIDE (POUR BTL) OPTIME
TOPICAL | Status: DC | PRN
  Administered 2014-02-23: 1000 mL

## 2014-02-23 MED ORDER — LIDOCAINE HCL (CARDIAC) 20 MG/ML IV SOLN
INTRAVENOUS | Status: DC | PRN
  Administered 2014-02-23 (×3): 20 mg via INTRAVENOUS

## 2014-02-23 MED ORDER — LACTATED RINGERS IV SOLN
INTRAVENOUS | Status: DC
  Administered 2014-02-23: 07:00:00 via INTRAVENOUS

## 2014-02-23 MED ORDER — SIMETHICONE 80 MG PO CHEW: 80.0000 mg | CHEWABLE_TABLET | Freq: Four times a day (QID) | ORAL | Status: DC | PRN

## 2014-02-23 MED ORDER — VASOPRESSIN 20 UNIT/ML IJ SOLN
INTRAMUSCULAR | Status: AC
  Filled 2014-02-23: qty 1

## 2014-02-23 MED ORDER — ESTRADIOL 0.1 MG/GM VA CREA
TOPICAL_CREAM | VAGINAL | Status: AC
  Filled 2014-02-23: qty 42.5

## 2014-02-23 MED ORDER — ZOLPIDEM TARTRATE 5 MG PO TABS: 5.0000 mg | ORAL_TABLET | Freq: Every evening | ORAL | Status: DC | PRN

## 2014-02-23 MED ORDER — FENTANYL CITRATE 0.05 MG/ML IJ SOLN
INTRAMUSCULAR | Status: DC | PRN
  Administered 2014-02-23: 25 ug via INTRATHECAL
  Administered 2014-02-23 (×3): 25 ug via INTRAVENOUS

## 2014-02-23 MED ORDER — FENTANYL CITRATE 0.05 MG/ML IJ SOLN: 25.0000 ug | INTRAMUSCULAR | Status: DC | PRN

## 2014-02-23 MED ORDER — PROPOFOL INFUSION 10 MG/ML OPTIME
INTRAVENOUS | Status: DC | PRN
  Administered 2014-02-23: 25 ug/kg/min via INTRAVENOUS

## 2014-02-23 MED ORDER — CIPROFLOXACIN IN D5W 400 MG/200ML IV SOLN
400.0000 mg | Freq: Once | INTRAVENOUS | Status: AC
  Administered 2014-02-23: 400 mg via INTRAVENOUS
  Filled 2014-02-23: qty 200

## 2014-02-23 MED ORDER — ALUM & MAG HYDROXIDE-SIMETH 200-200-20 MG/5ML PO SUSP: 30.0000 mL | ORAL | Status: DC | PRN

## 2014-02-23 MED ORDER — BUPIVACAINE IN DEXTROSE 0.75-8.25 % IT SOLN
INTRATHECAL | Status: DC | PRN
  Administered 2014-02-23: 1.8 mL via INTRATHECAL

## 2014-02-23 MED ORDER — PROPOFOL 10 MG/ML IV EMUL
INTRAVENOUS | Status: AC
  Filled 2014-02-23: qty 40

## 2014-02-23 MED ORDER — ONDANSETRON HCL 4 MG/2ML IJ SOLN: 4.0000 mg | Freq: Four times a day (QID) | INTRAMUSCULAR | Status: DC | PRN

## 2014-02-23 MED ORDER — PROPOFOL 10 MG/ML IV BOLUS
INTRAVENOUS | Status: DC | PRN
  Administered 2014-02-23: 30 mg via INTRAVENOUS
  Administered 2014-02-23: 20 mg via INTRAVENOUS
  Administered 2014-02-23 (×2): 30 mg via INTRAVENOUS
  Administered 2014-02-23: 20 mg via INTRAVENOUS
  Administered 2014-02-23: 30 mg via INTRAVENOUS
  Administered 2014-02-23: 10 mg via INTRAVENOUS
  Administered 2014-02-23 (×2): 20 mg via INTRAVENOUS

## 2014-02-23 MED ORDER — PHENYLEPHRINE HCL 10 MG/ML IJ SOLN
INTRAMUSCULAR | Status: DC | PRN
  Administered 2014-02-23 (×4): 40 ug via INTRAVENOUS

## 2014-02-23 MED ORDER — VASOPRESSIN 20 UNIT/ML IJ SOLN
INTRAMUSCULAR | Status: DC | PRN
Start: 2014-02-23 — End: 2014-02-23
  Administered 2014-02-23: 20 [IU]

## 2014-02-23 MED ORDER — NALOXONE HCL 0.4 MG/ML IJ SOLN: 0.4000 mg | INTRAMUSCULAR | Status: DC | PRN

## 2014-02-23 MED ORDER — MENTHOL 3 MG MT LOZG: 1.0000 | LOZENGE | OROMUCOSAL | Status: DC | PRN

## 2014-02-23 MED ORDER — DIPHENHYDRAMINE HCL 50 MG/ML IJ SOLN: 12.5000 mg | Freq: Four times a day (QID) | INTRAMUSCULAR | Status: DC | PRN

## 2014-02-23 MED ORDER — DEXAMETHASONE SODIUM PHOSPHATE 10 MG/ML IJ SOLN
INTRAMUSCULAR | Status: DC | PRN
  Administered 2014-02-23: 5 mg via INTRAVENOUS

## 2014-02-23 MED ORDER — DIPHENHYDRAMINE HCL 12.5 MG/5ML PO ELIX
12.5000 mg | ORAL_SOLUTION | Freq: Four times a day (QID) | ORAL | Status: DC | PRN
  Filled 2014-02-23: qty 5

## 2014-02-23 MED ORDER — LACTATED RINGERS IV SOLN
INTRAVENOUS | Status: DC
  Administered 2014-02-23 (×3): via INTRAVENOUS

## 2014-02-23 MED ORDER — ONDANSETRON HCL 4 MG PO TABS: 4.0000 mg | ORAL_TABLET | Freq: Four times a day (QID) | ORAL | Status: DC | PRN

## 2014-02-23 MED ORDER — SODIUM CHLORIDE 0.9 % IJ SOLN
INTRAMUSCULAR | Status: DC | PRN
  Administered 2014-02-23: 20 mL via INTRAVENOUS

## 2014-02-23 MED ORDER — SODIUM CHLORIDE 0.9 % IJ SOLN
INTRAMUSCULAR | Status: AC
  Filled 2014-02-23: qty 20

## 2014-02-23 SURGICAL SUPPLY — 74 items
ADH SKN CLS APL DERMABOND .7 (GAUZE/BANDAGES/DRESSINGS) ×2
BLADE 15 SAFETY STRL DISP (BLADE) ×8 IMPLANT
CANISTER SUCT 3000ML (MISCELLANEOUS) ×4 IMPLANT
CATH FOLEY 2WAY SLVR  5CC 16FR (CATHETERS) ×2
CATH FOLEY 2WAY SLVR 5CC 16FR (CATHETERS) ×2 IMPLANT
CATH FOLEY LATEX FREE 14FR (CATHETERS) ×4
CATH FOLEY LF 14FR (CATHETERS) IMPLANT
CATH ROBINSON RED A/P 16FR (CATHETERS) IMPLANT
CLOTH BEACON ORANGE TIMEOUT ST (SAFETY) ×4 IMPLANT
CONT PATH 16OZ SNAP LID 3702 (MISCELLANEOUS) IMPLANT
COVER MAYO STAND STRL (DRAPES) ×4 IMPLANT
DECANTER SPIKE VIAL GLASS SM (MISCELLANEOUS) IMPLANT
DERMABOND ADVANCED (GAUZE/BANDAGES/DRESSINGS) ×2
DERMABOND ADVANCED .7 DNX12 (GAUZE/BANDAGES/DRESSINGS) ×2 IMPLANT
DEVICE CAPIO SLIM SINGLE (INSTRUMENTS) IMPLANT
DRAIN PENROSE 1/4X12 LTX (DRAIN) ×4 IMPLANT
DRAPE HYSTEROSCOPY (DRAPE) IMPLANT
DRAPE UNDERBUTTOCKS STRL (DRAPE) ×4 IMPLANT
DRSG COVADERM PLUS 2X2 (GAUZE/BANDAGES/DRESSINGS) ×2 IMPLANT
GAUZE PACKING 1 X5 YD ST (GAUZE/BANDAGES/DRESSINGS) IMPLANT
GAUZE PACKING 2X5 YD STRL (GAUZE/BANDAGES/DRESSINGS) ×4 IMPLANT
GAUZE SPONGE 4X4 16PLY XRAY LF (GAUZE/BANDAGES/DRESSINGS) ×2 IMPLANT
GLOVE BIO SURGEON STRL SZ 6.5 (GLOVE) ×3 IMPLANT
GLOVE BIO SURGEON STRL SZ7 (GLOVE) ×4 IMPLANT
GLOVE BIO SURGEON STRL SZ7.5 (GLOVE) ×4 IMPLANT
GLOVE BIO SURGEONS STRL SZ 6.5 (GLOVE) ×1
GLOVE BIOGEL PI IND STRL 6.5 (GLOVE) IMPLANT
GLOVE BIOGEL PI IND STRL 7.0 (GLOVE) IMPLANT
GLOVE BIOGEL PI IND STRL 8 (GLOVE) ×4 IMPLANT
GLOVE BIOGEL PI INDICATOR 6.5 (GLOVE) ×4
GLOVE BIOGEL PI INDICATOR 7.0 (GLOVE) ×6
GLOVE BIOGEL PI INDICATOR 8 (GLOVE) ×4
GLOVE ORTHO TXT STRL SZ7.5 (GLOVE) ×3 IMPLANT
GOWN STRL REUS W/ TWL XL LVL3 (GOWN DISPOSABLE) IMPLANT
GOWN STRL REUS W/TWL LRG LVL3 (GOWN DISPOSABLE) ×20 IMPLANT
GOWN STRL REUS W/TWL XL LVL3 (GOWN DISPOSABLE) ×4
MARKER SKIN DUAL TIP RULER LAB (MISCELLANEOUS) ×2 IMPLANT
NDL SPNL 22GX3.5 QUINCKE BK (NEEDLE) IMPLANT
NEEDLE HYPO 22GX1.5 SAFETY (NEEDLE) ×6 IMPLANT
NEEDLE MAYO .5 CIRCLE (NEEDLE) IMPLANT
NEEDLE SPNL 22GX3.5 QUINCKE BK (NEEDLE) IMPLANT
NS IRRIG 1000ML POUR BTL (IV SOLUTION) ×8 IMPLANT
PACK VAGINAL WOMENS (CUSTOM PROCEDURE TRAY) ×4 IMPLANT
PENCIL BUTTON HOLSTER BLD 10FT (ELECTRODE) ×4 IMPLANT
PLUG CATH AND CAP STER (CATHETERS) ×4 IMPLANT
RETRACTOR STAY HOOK 5MM (MISCELLANEOUS) IMPLANT
SET CYSTO W/LG BORE CLAMP LF (SET/KITS/TRAYS/PACK) ×4 IMPLANT
SHEET LAVH (DRAPES) ×4 IMPLANT
SLING SYSTEM SPARC (Sling) ×3 IMPLANT
SUT CAPIO ETHIBPND (SUTURE) IMPLANT
SUT PROLENE 1 CTX 30  8455H (SUTURE)
SUT PROLENE 1 CTX 30 8455H (SUTURE) IMPLANT
SUT SILK 0 SH 30 (SUTURE) ×4 IMPLANT
SUT SILK 2 0 FS (SUTURE) IMPLANT
SUT VIC AB 0 CT1 27 (SUTURE)
SUT VIC AB 0 CT1 27XBRD ANBCTR (SUTURE) IMPLANT
SUT VIC AB 2-0 CT2 27 (SUTURE) ×18 IMPLANT
SUT VIC AB 2-0 SH 27 (SUTURE)
SUT VIC AB 2-0 SH 27XBRD (SUTURE) IMPLANT
SUT VIC AB 3-0 CT1 27 (SUTURE)
SUT VIC AB 3-0 CT1 TAPERPNT 27 (SUTURE) IMPLANT
SUT VIC AB 3-0 SH 27 (SUTURE) ×8
SUT VIC AB 3-0 SH 27X BRD (SUTURE) ×4 IMPLANT
SUT VIC AB 4-0 PS2 27 (SUTURE) ×4 IMPLANT
SUT VICRYL 0 UR6 27IN ABS (SUTURE) IMPLANT
SYR BULB IRRIGATION 50ML (SYRINGE) ×2 IMPLANT
SYR CONTROL 10ML LL (SYRINGE) ×2 IMPLANT
TOWEL OR 17X24 6PK STRL BLUE (TOWEL DISPOSABLE) ×16 IMPLANT
TRAY FOLEY CATH 14FR (SET/KITS/TRAYS/PACK) ×8 IMPLANT
TUBING CONNECTING 10 (TUBING) ×3 IMPLANT
TUBING CONNECTING 10' (TUBING) ×1
TUBING NON-CON 1/4 X 20 CONN (TUBING) ×3 IMPLANT
TUBING NON-CON 1/4 X 20' CONN (TUBING) ×1
WATER STERILE IRR 1000ML POUR (IV SOLUTION) ×8 IMPLANT

## 2014-02-23 NOTE — Brief Op Note (Signed)
02/23/2014  10:14 AM  PATIENT:  Misty SitesPamela Dodson  78 y.o. female  PRE-OPERATIVE DIAGNOSIS:  Relaxation of Pelvic Floor  POST-OPERATIVE DIAGNOSIS:  Relaxation of Pelvic Floor  PROCEDURE:  Procedure(s): ANTERIOR (CYSTOCELE) AND POSTERIOR REPAIR (RECTOCELE) (N/A) CYSTOSCOPY (N/A) PUBO-VAGINAL SLING (N/A)  SURGEON:  Surgeon(s) and Role: Panel 1:    * Lavina Hammanodd Meisinger, MD - Assisting    * Sherian ReinJody [name], MD - Primary  Panel 2:    * Martina SinnerScott A MacDiarmid, MD - Primary  ANESTHESIA:   spinal and sedation  EBL:  Total I/O In: 1250 [I.V.:1250] Out: 150 [Urine:100; Blood:50]  BLOOD ADMINISTERED:none  DRAINS: Urinary Catheter (Foley)   LOCAL MEDICATIONS USED:  Vasopressin Amount: 12 ml  SPECIMEN:  No Specimen  DISPOSITION OF SPECIMEN:  N/A  COUNTS:  YES  TOURNIQUET:  * No tourniquets in log *  DICTATION: .Other Dictation: Dictation Number [id number]  PLAN OF CARE: Admit for overnight observation  PATIENT DISPOSITION:  PACU - hemodynamically stable.   Delay start of Pharmacological VTE agent (>24hrs) due to surgical blood loss or risk of bleeding: not applicable

## 2014-02-23 NOTE — Interval H&P Note (Signed)
History and Physical Interval Note:  02/23/2014 7:26 AM  Misty Dodson  has presented today for surgery, with the diagnosis of Relaxation of Pelvic Floor  The various methods of treatment have been discussed with the patient and family. After consideration of risks, benefits and other options for treatment, the patient has consented to  Procedure(s): ANTERIOR (CYSTOCELE) AND POSTERIOR REPAIR (RECTOCELE) (N/A) CYSTOSCOPY (N/A) PUBO-VAGINAL SLING (N/A) as a surgical intervention .  The patient's history has been reviewed, patient examined, no change in status, stable for surgery.  I have reviewed the patient's chart and labs.  Questions were answered to the patient's satisfaction.     [name], Necha Harries

## 2014-02-23 NOTE — Anesthesia Postprocedure Evaluation (Signed)
Anesthesia Post Note  Patient: Misty Dodson  Procedure(s) Performed: Procedure(s) (LRB): ANTERIOR (CYSTOCELE) AND POSTERIOR REPAIR (RECTOCELE) (N/A) CYSTOSCOPY (N/A) PUBO-VAGINAL SLING (N/A)  Anesthesia type: Spinal  Patient location: Mother/Baby  Post pain: Pain level controlled  Post assessment: Post-op Vital signs reviewed  Last Vitals:  Filed Vitals:   02/23/14 1355  BP: 156/79  Pulse: 87  Temp: 36.6 C  Resp: 18    Post vital signs: Reviewed  Level of consciousness: awake  Complications: No apparent anesthesia complications

## 2014-02-23 NOTE — Op Note (Signed)
Preoperative diagnosis: Stress urinary incontinence Postoperative diagnosis: Stress urinary incontinence Procedure: Sling cystourethropexy [facility](SPARC)  Surgeon: Dr. Lorin PicketScott Vivek Grealish Assistant: Dr Julius BowelsJed Ferguson   The patient was prepped and draped in the usual fashion. Extra care was taken with leg positioning to minimize the  risk of compartment syndrome, neuropathy, and deep vein thrombosis. Preoperative laboratory tests were normal and preoperative antibiotics were given. Gynecology did the AandP repair prior  Two less than 1 cm incisions were made 1 fingerbreadth above the symphysis pubis 1.5 cm lateral to the midline. A 2 cm appropriate depth suburethral incision was made underneath the mid urethra after instilling approximately 5 cc of 1% lidocaine mixture. I sharply and bluntly dissected to the urethral vesical angle bilaterally.  With the bladder empty I passed a SPARC needle on top of and along the back of the symphysis pubis staying  on the periosteum and staying lateral using my box technique and delivering the needle onto the pulp of my  index finger bilaterally.  I cystoscoped the patient thoroughly and there was no injury to the bladder or urethra. There was no movement  or indentation of the bladder with movement of the trocar. There was excellent efflux of blue urine bilaterally.  With the bladder emptied I [facility] the [facility] and brought it up through the retropubic space bilaterally. I tensioned it over the fat part of a moderate size Kelly clamp. I cut below the blue dots, irrigated the sheaths, and removed the sheaths. I was very happy with the position and tension of the sling With appropriate hypermobility and no springback effect.  All incisions were irrigated. The sling was cut below the skin level. I closed the anterior vaginal wall with  running 2-0 Vicryl followed by my interrupted sutures. Interrupted 4-0 Vicryl was used for the abdominal incisions.  Catheter  to straight drainage and vaginal pack applied.   The patient was taken to the recovery room and hopefully this procedure will reach her treatment goal.

## 2014-02-23 NOTE — Anesthesia Procedure Notes (Signed)
Spinal  Patient location during procedure: OR Start time: 02/23/2014 7:42 AM Staffing Anesthesiologist: Brayton CavesJACKSON, Vinita Prentiss Performed by: anesthesiologist  Preanesthetic Checklist Completed: patient identified, site marked, surgical consent, pre-op evaluation, timeout performed, IV checked, risks and benefits discussed and monitors and equipment checked Spinal Block Patient position: sitting Prep: DuraPrep Patient monitoring: heart rate, cardiac monitor, continuous pulse ox and blood pressure Approach: midline Location: L3-4 Injection technique: single-shot Needle Needle type: Sprotte  Needle gauge: 24 G Needle length: 9 cm Assessment Sensory level: T4 Additional Notes Patient identified.  Risk benefits discussed including failed block, incomplete pain control, headache, nerve damage, paralysis, blood pressure changes, nausea, vomiting, reactions to medication both toxic or allergic, and postpartum back pain.  Patient expressed understanding and wished to proceed.  All questions were answered.  Sterile technique used throughout procedure.  CSF was clear.  No parasthesia or other complications.  Please see nursing notes for vital signs.

## 2014-02-23 NOTE — Progress Notes (Signed)
Looks good No pain Sleep aid ordered See in am

## 2014-02-23 NOTE — Addendum Note (Signed)
Addendum created 02/23/14 1102 by Brayton CavesFreeman Verner Kopischke, MD   Modules edited: Orders, PRL Based Order Sets

## 2014-02-23 NOTE — Addendum Note (Signed)
Addendum created 02/23/14 1643 by Algis GreenhouseLinda A Kalkidan Caudell, CRNA   Modules edited: Notes Section   Notes Section:  File: [id number]

## 2014-02-23 NOTE — Progress Notes (Signed)
Patient ID: Misty Dodson, female   DOB: 1935-07-31, 78 y.o.   MRN: [id number] Pt is awake and doing well. VS are normal and the urine is clear and yellow.

## 2014-02-23 NOTE — Anesthesia Preprocedure Evaluation (Signed)
Anesthesia Evaluation  Patient identified by MRN, date of birth, ID band Patient awake    Reviewed: Allergy & Precautions, H&P , NPO status , Patient's Chart, lab work & pertinent test results, reviewed documented beta blocker date and time   History of Anesthesia Complications (+) Family history of anesthesia reactionNegative for: history of anesthetic complications  Airway Mallampati: III TM Distance: >3 FB Neck ROM: Full    Dental  (+) Teeth Intact   Pulmonary neg shortness of breath, neg sleep apnea, neg COPDformer smoker,          Cardiovascular hypertension, Pt. on medications + Valvular Problems/Murmurs Rhythm:Regular     Neuro/Psych PSYCHIATRIC DISORDERS Anxiety negative neurological ROS  negative psych ROS   GI/Hepatic Neg liver ROS, GERD-  Medicated and Controlled,  Endo/Other  negative endocrine ROS  Renal/GU negative Renal ROS     Musculoskeletal   Abdominal   Peds  Hematology negative hematology ROS (+)   Anesthesia Other Findings   Reproductive/Obstetrics                           Anesthesia Physical Anesthesia Plan  ASA: II  Anesthesia Plan: Spinal and MAC   Post-op Pain Management:    Induction:   Airway Management Planned: Natural Airway  Additional Equipment: None  Intra-op Plan:   Post-operative Plan:   Informed Consent: I have reviewed the patients History and Physical, chart, labs and discussed the procedure including the risks, benefits and alternatives for the proposed anesthesia with the patient or authorized representative who has indicated his/her understanding and acceptance.   Dental advisory given  Plan Discussed with: CRNA and Surgeon  Anesthesia Plan Comments:         Anesthesia Quick Evaluation

## 2014-02-23 NOTE — Transfer of Care (Signed)
Immediate Anesthesia Transfer of Care Note  Patient: Michiel SitesPamela Dollard  Procedure(s) Performed: Procedure(s): ANTERIOR (CYSTOCELE) AND POSTERIOR REPAIR (RECTOCELE) (N/A) CYSTOSCOPY (N/A) PUBO-VAGINAL SLING (N/A)  Patient Location: PACU  Anesthesia Type:Spinal  Level of Consciousness: awake, alert , oriented and patient cooperative  Airway & Oxygen Therapy: Patient Spontanous Breathing  Post-op Assessment: Report given to PACU RN and Post -op Vital signs reviewed and stable  Post vital signs: Reviewed and stable  Complications: No apparent anesthesia complications

## 2014-02-23 NOTE — Anesthesia Postprocedure Evaluation (Signed)
  Anesthesia Post Note  Patient: Misty Dodson  Procedure(s) Performed: Procedure(s) (LRB): ANTERIOR (CYSTOCELE) AND POSTERIOR REPAIR (RECTOCELE) (N/A) CYSTOSCOPY (N/A) PUBO-VAGINAL SLING (N/A)  Anesthesia type: Spinal  Patient location: PACU  Post pain: Pain level controlled  Post assessment: Post-op Vital signs reviewed  Last Vitals:  Filed Vitals:   02/23/14 1035  BP: 123/55  Pulse: 63  Temp: 36.9 C  Resp: 20    Post vital signs: Reviewed  Level of consciousness: awake  Complications: No apparent anesthesia complications

## 2014-02-24 ENCOUNTER — Encounter (HOSPITAL_COMMUNITY): Payer: Self-pay | Admitting: Obstetrics and Gynecology

## 2014-02-24 DIAGNOSIS — N993 Prolapse of vaginal vault after hysterectomy: Secondary | ICD-10-CM | POA: Diagnosis not present

## 2014-02-24 LAB — CBC
HCT: 34.7 % — ABNORMAL LOW (ref 36.0–46.0)
Hemoglobin: 11.9 g/dL — ABNORMAL LOW (ref 12.0–15.0)
MCH: 32.4 pg (ref 26.0–34.0)
MCHC: 34.3 g/dL (ref 30.0–36.0)
MCV: 94.6 fL (ref 78.0–100.0)
Platelets: 141 10*3/uL — ABNORMAL LOW (ref 150–400)
RBC: 3.67 MIL/uL — AB (ref 3.87–5.11)
RDW: 12.6 % (ref 11.5–15.5)
WBC: 7.6 10*3/uL (ref 4.0–10.5)

## 2014-02-24 LAB — BASIC METABOLIC PANEL
ANION GAP: 9 (ref 5–15)
BUN: 14 mg/dL (ref 6–23)
CHLORIDE: 104 meq/L (ref 96–112)
CO2: 29 mEq/L (ref 19–32)
Calcium: 8.7 mg/dL (ref 8.4–10.5)
Creatinine, Ser: 0.6 mg/dL (ref 0.50–1.10)
GFR, EST NON AFRICAN AMERICAN: 87 mL/min — AB (ref >90)
Glucose, Bld: 107 mg/dL — ABNORMAL HIGH (ref 70–99)
POTASSIUM: 3.7 meq/L (ref 3.7–5.3)
SODIUM: 142 meq/L (ref 137–147)

## 2014-02-24 MED ORDER — OXYCODONE-ACETAMINOPHEN 5-325 MG PO TABS: 1.0000 | ORAL_TABLET | Freq: Four times a day (QID) | ORAL | Status: AC | PRN

## 2014-02-24 MED ORDER — IBUPROFEN 800 MG PO TABS: 800.0000 mg | ORAL_TABLET | Freq: Three times a day (TID) | ORAL | Status: AC | PRN

## 2014-02-24 MED ORDER — CIPROFLOXACIN HCL 250 MG PO TABS: 250.0000 mg | ORAL_TABLET | Freq: Two times a day (BID) | ORAL | Status: AC

## 2014-02-24 MED ORDER — POLYETHYLENE GLYCOL 3350 17 G PO PACK: 17.0000 g | PACK | Freq: Every day | ORAL | Status: AC

## 2014-02-24 NOTE — Op Note (Signed)
NAMMichiel Sites:  Stjulien, Kiauna                 ACCOUNT NO.:  [id number]  MEDICAL RECORD NO.:  [id number]  LOCATION:  9304                          FACILITY:  [facility]  PHYSICIAN:  Sherron MondayJody Bovard, MD        DATE OF BIRTH:  03-Feb-1936  DATE OF PROCEDURE:  02/23/2014 DATE OF DISCHARGE:                              OPERATIVE REPORT   PREOPERATIVE DIAGNOSIS:  Pelvic floor relaxation.  POSTOPERATIVE DIAGNOSIS:  Pelvic floor relaxation.  PROCEDURE:  Anterior and posterior repair as well as pubovaginal sling with cystoscopy by Alfredo MartinezScott MacDiarmid.  SURGEON:  Sherron MondayJody Bovard, MD  ASSISTANT:  Zenaida Nieceodd D. Meisinger, M.D.  Surgeon for the sling is Northrop GrummanScott MacDiarmid.  ANESTHESIA:  Spinal and sedation.  EBL:  Approximately 50 mL.  URINE OUTPUT:  100 mL.  IV FLUIDS:  1250.  Catheter was placed at the end of the procedure.  PATHOLOGY:  None.  COMPLICATIONS:  None.  PROCEDURE:  After informed consent was reviewed with the patient and her husband, we discussed the risks, benefits, and alternatives of the surgical procedure, and she was transported to the OR.  The patient was placed on the table in supine position.  After spinal anesthesia had been induced, she was then prepped and draped in the normal sterile fashion, placed in the Yellofin stirrups.  Her bladder was sterilely drained as a Foley was placed at the level of her vaginal cuff,  The mucosa was grasped with Allis clamps, and incised in the midline. The mucosa was divided.  The underlying fascia was dissected and reapproximated using 3-0 Vicryl.  The mucosa was trimmed and reapproximated using 2-0 Vicryl.  The similar procedure was made in the posterior aspect of her vagina, at the level of her posterior hymenal ring.  Allis clamps were used to outline.  The mucosa was incised and carried up to the level of her cuff.  The fascia was dissected and reapproximated using 3-0 Vicryl. The mucosa was closed with 2-0 Vicryl after being trimmed.  The procedure  was then turned over to Dr. Alfredo MartinezScott MacDiarmid for a Sparc repair.  At the conclusion of this, her vagina was packed with 1- inch gauze that was soaked in Estrace cream.  Sponge, lap, and needle counts were correct x2.  The patient tolerated the procedure well.     Sherron MondayJody Bovard, MD     JB/MEDQ  D:  02/23/2014  T:  02/23/2014  Job:  [id number]

## 2014-02-24 NOTE — Progress Notes (Signed)
Discharge instructions reviewed with patient.  Patient states understanding of home care, activity, medications, signs/symptoms to report to MD and return Md office visit.  Significant other will assist with patients care @ home and no home equipment needed.  Patient ambulated for discharge in stable condition with staff without incident.

## 2014-02-24 NOTE — Discharge Summary (Signed)
Physician Discharge Summary  Patient ID: Michiel Sitesamela Royals MRN: [id number] DOB/AGE: [birth year] 78 y.o.  Admit date: 02/23/2014 Discharge date: 02/24/2014  Admission Diagnoses: pelvic relaxation  Discharge Diagnoses:  Principal Problem:   Pelvic floor relaxation Active Problems:   Pelvic relaxation disorder   Discharged Condition: stable  Hospital Course: Admitted 8/4 for A&P repair and midurethral sling.  Underwent surgery w/o problem.  D/C toi home POD#1 with pain, controlled, ambulating and voiding.  Hgb appropriate after surgery  Consults: None  Significant Diagnostic Studies: labs: CBC, BMP  Treatments: IV hydration, analgesia: PCA and surgery: A&P repair, midurethral sling  Discharge Exam: Blood pressure 144/72, pulse 65, temperature 98.3 F (36.8 C), temperature source Oral, resp. rate 18, height 5\' 7"  (1.702 m), weight 58.06 kg (128 lb), SpO2 94.00%. General appearance: alert and no distress Resp: clear to auscultation bilaterally Cardio: regular rate and rhythm Extremities: extremities normal, atraumatic, no cyanosis or edema  Disposition: 01-Home or Self Care  Discharge Instructions   Call MD for:  persistant nausea and vomiting    Complete by:  As directed      Call MD for:  severe uncontrolled pain    Complete by:  As directed      Diet - low sodium heart healthy    Complete by:  As directed      Discharge instructions    Complete by:  As directed   Call [phone] with questions or problems     Driving Restrictions    Complete by:  As directed   While taking strong pain medicine     Increase activity slowly    Complete by:  As directed      Lifting restrictions    Complete by:  As directed   No greater than 10lbs     May shower / Bathe    Complete by:  As directed      May walk up steps    Complete by:  As directed      Sexual Activity Restrictions    Complete by:  As directed   Pelvic rest - no douching, tampons or sex for 6 weeks             Medication List         acetaminophen 500 MG tablet  Commonly known as:  TYLENOL  Take 500 mg by mouth every 6 (six) hours as needed for mild pain or headache. For pain/headache     celecoxib 200 MG capsule  Commonly known as:  CELEBREX  Take 200 mg by mouth daily.     ciprofloxacin 250 MG tablet  Commonly known as:  CIPRO  Take 1 tablet (250 mg total) by mouth 2 (two) times daily.     esomeprazole 20 MG capsule  Commonly known as:  NEXIUM  Take 20 mg by mouth daily at 12 noon.     gabapentin 300 MG capsule  Commonly known as:  NEURONTIN  Take 300 mg by mouth 2 (two) times daily.     ibuprofen 800 MG tablet  Commonly known as:  ADVIL,MOTRIN  Take 1 tablet (800 mg total) by mouth every 8 (eight) hours as needed (mild pain).     Melatonin 10 MG Caps  Take 1 capsule by mouth at bedtime as needed (For sleep.).     oxyCODONE-acetaminophen 5-325 MG per tablet  Commonly known as:  PERCOCET/ROXICET  Take 1-2 tablets by mouth every 6 (six) hours as needed for severe pain (moderate to severe pain (when tolerating  fluids)).     polyethylene glycol packet  Commonly known as:  MIRALAX / GLYCOLAX  Take 17 g by mouth daily.     propranolol 10 MG tablet  Commonly known as:  INDERAL  Take 10 mg by mouth daily.     zolpidem 10 MG tablet  Commonly known as:  AMBIEN  Take 5 mg by mouth at bedtime as needed for sleep.           Follow-up Information   Follow up with [name], Jazz Rogala, MD. Schedule an appointment as soon as possible for a visit in 6 weeks. (for post-op check)    Specialty:  Obstetrics and Gynecology   Contact information:   [address] [place] [address] [phone]       Signed: Sherian Rein 02/24/2014, 7:24 AM

## 2014-02-24 NOTE — Progress Notes (Signed)
Looks good Labs normal Vitals normal Home insturctions given Trial of void

## 2014-02-24 NOTE — Progress Notes (Addendum)
1 Day Post-Op Procedure(s) (LRB): ANTERIOR (CYSTOCELE) AND POSTERIOR REPAIR (RECTOCELE) (N/A) CYSTOSCOPY (N/A) PUBO-VAGINAL SLING (N/A)  Subjective: Patient reports tolerating PO.  Pain controlled.  Pt "freaking out" feels tied done by foley, vag pack is a lot of pressure    Objective: I have reviewed patient's vital signs, intake and output and labs.  General: alert and no distress Resp: clear to auscultation bilaterally Cardio: regular rate and rhythm GI: soft, non-tender; bowel sounds normal; no masses,  no organomegaly Extremities: extremities normal, atraumatic, no cyanosis or edema Vaginal Bleeding: minimal  Assessment: s/p Procedure(s): ANTERIOR (CYSTOCELE) AND POSTERIOR REPAIR (RECTOCELE) (N/A) CYSTOSCOPY (N/A) PUBO-VAGINAL SLING (N/A): stable, progressing well and tolerating diet  Plan: Advance diet Encourage ambulation Advance to PO medication Discharge home when meeting criteria D/c vag pack, foley per Dr MacDiarmind  LOS: 1 day    [name], Misty Dodson 02/24/2014, 7:04 AM

## 2014-09-21 ENCOUNTER — Other Ambulatory Visit: Payer: Self-pay | Admitting: Rehabilitation

## 2014-09-21 DIAGNOSIS — M5136 Other intervertebral disc degeneration, lumbar region: Secondary | ICD-10-CM

## 2014-09-22 ENCOUNTER — Other Ambulatory Visit: Payer: Self-pay

## 2014-09-22 DIAGNOSIS — N63 Unspecified lump in unspecified breast: Secondary | ICD-10-CM

## 2014-10-09 ENCOUNTER — Ambulatory Visit
Admission: RE | Admit: 2014-10-09 | Discharge: 2014-10-09 | Disposition: A | Discharge status: Home / Self Care | Payer: Medicare Other | Source: Ambulatory Visit | Attending: Rehabilitation | Admitting: Rehabilitation

## 2014-10-09 DIAGNOSIS — M5136 Other intervertebral disc degeneration, lumbar region: Secondary | ICD-10-CM

## 2017-07-22 ENCOUNTER — Other Ambulatory Visit: Payer: Self-pay | Admitting: Rehabilitation

## 2017-07-22 DIAGNOSIS — M47812 Spondylosis without myelopathy or radiculopathy, cervical region: Secondary | ICD-10-CM

## 2017-07-31 ENCOUNTER — Ambulatory Visit
Admission: RE | Admit: 2017-07-31 | Discharge: 2017-07-31 | Disposition: A | Discharge status: Home / Self Care | Payer: Medicare Other | Source: Ambulatory Visit | Attending: Rehabilitation | Admitting: Rehabilitation

## 2017-07-31 DIAGNOSIS — M47812 Spondylosis without myelopathy or radiculopathy, cervical region: Secondary | ICD-10-CM

## 2017-07-31 IMAGING — MR MR CERVICAL SPINE W/O CM
4 of 5 series · 25 of 48 positions shown · non-contrast
Comparison: None.

CLINICAL DATA: Neck pain getting worse for 2 years. Radiates along
the left side of the head. No known injury.

EXAM:
MRI CERVICAL SPINE WITHOUT CONTRAST
TECHNIQUE: Multiplanar, multisequence MR imaging of the cervical spine was
performed. No intravenous contrast was administered.

[Series 3: T2 post-contrast · sagittal · 3.3mm · 0.37mm/px · 6 of 13 slices shown]
[im 1/13]
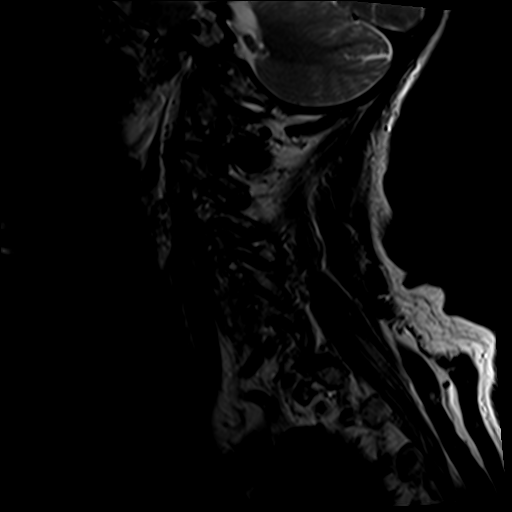
[im 3/13]
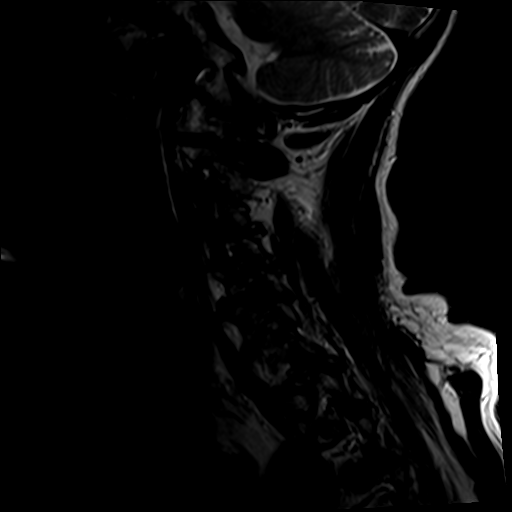
[im 5/13]
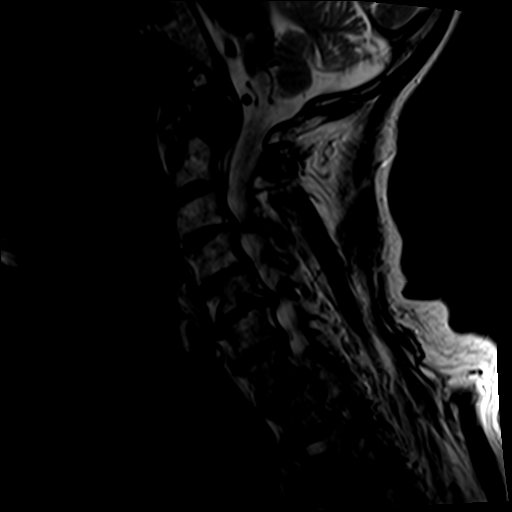
[im 8/13]
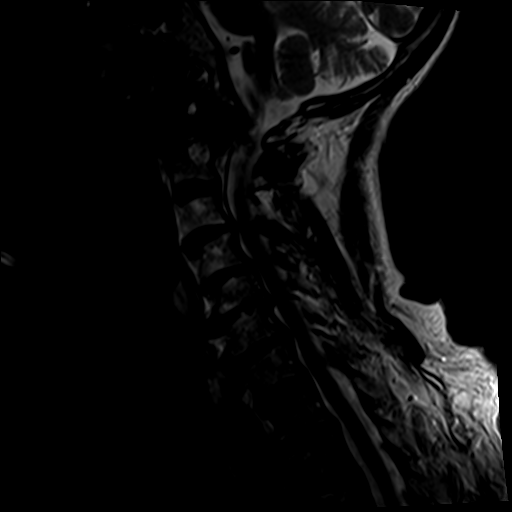
[im 10/13]
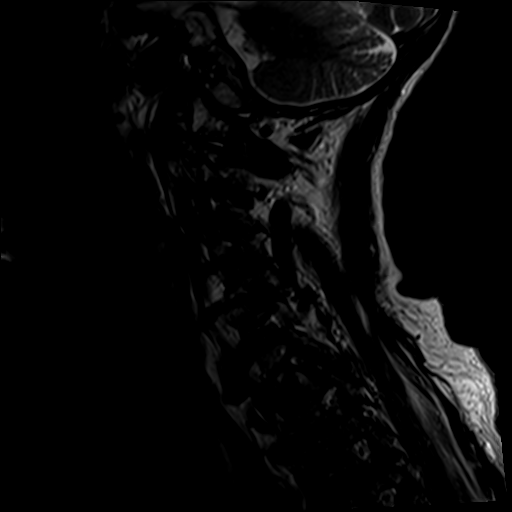
[im 13/13]
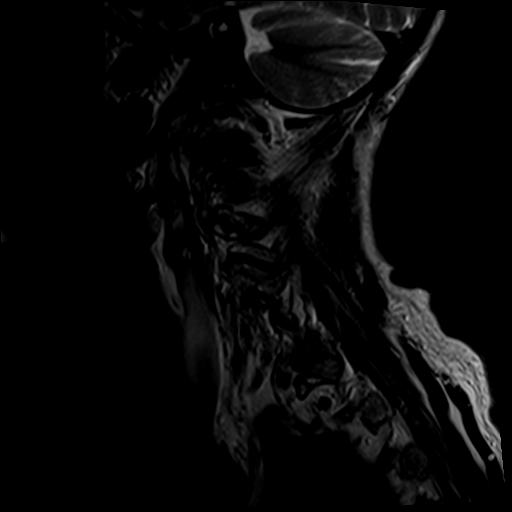

[Series 4: T1 · sagittal · 3.3mm · 0.37mm/px · 5 of 13 slices shown]
[im 1/13]
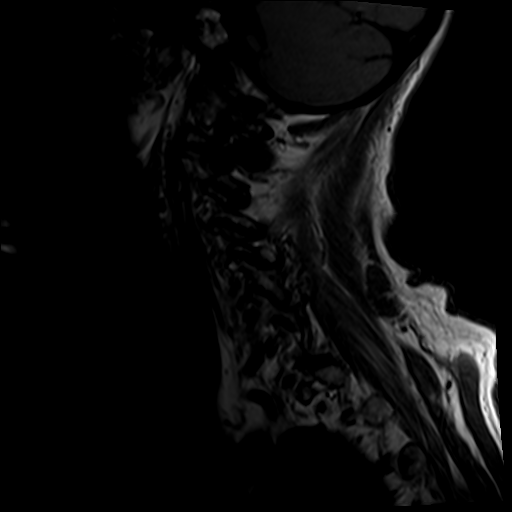
[im 4/13]
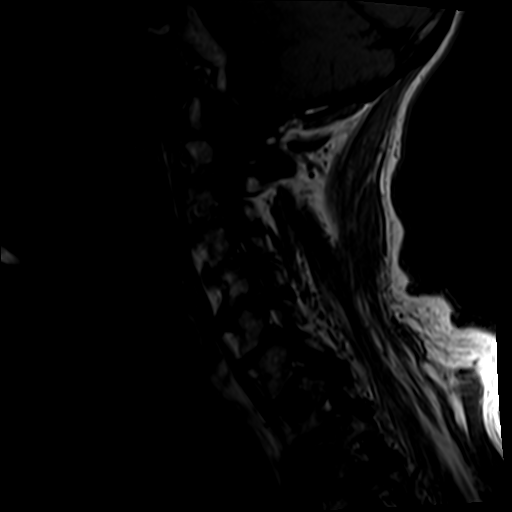
[im 7/13]
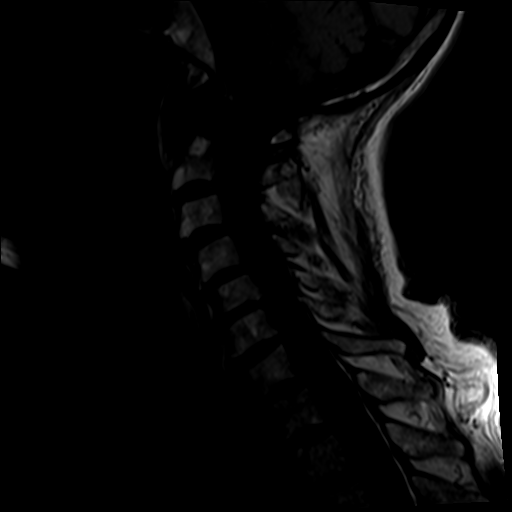
[im 10/13]
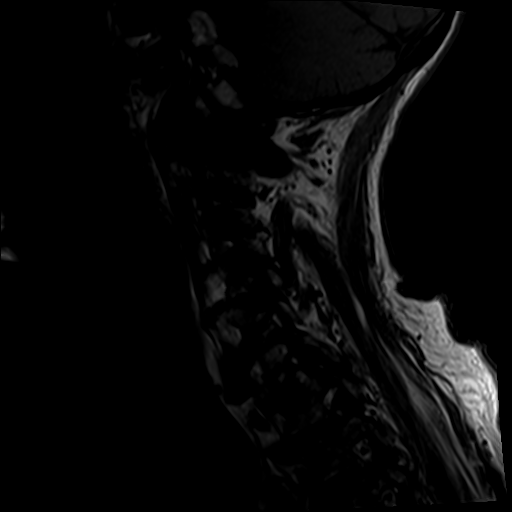
[im 13/13]
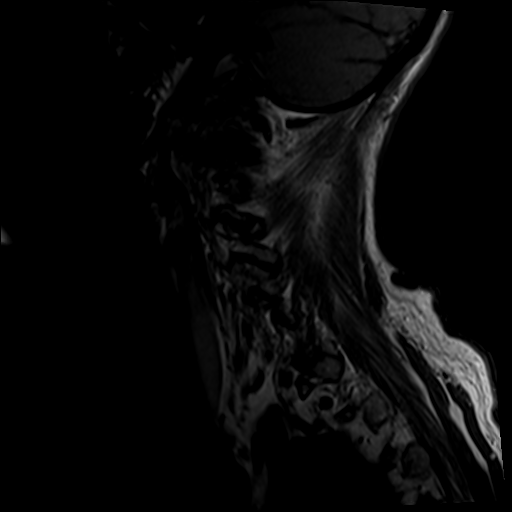

[Series 5: tir sag · sagittal · 3.3mm · 0.37mm/px · 4 of 13 slices shown]
[im 1/13]
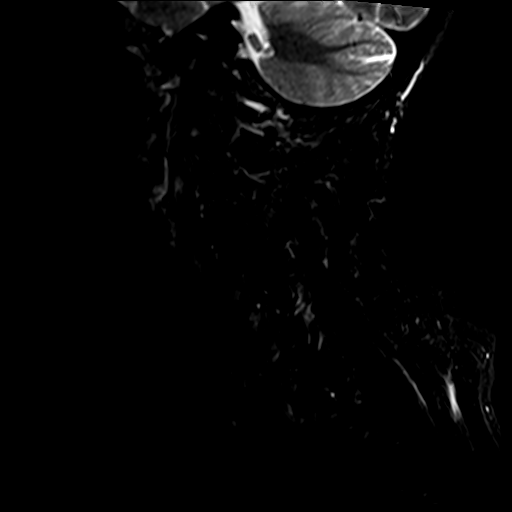
[im 4/13]
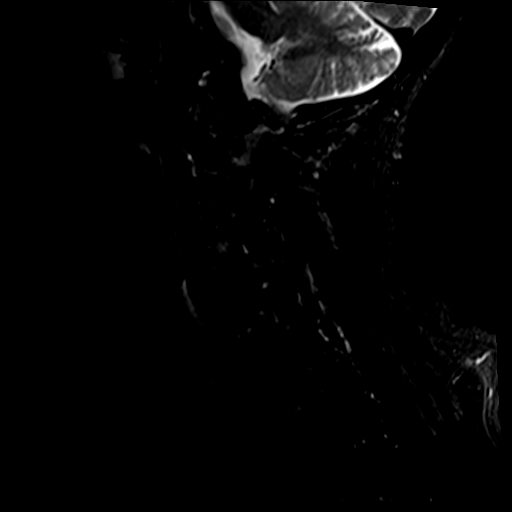
[im 7/13]
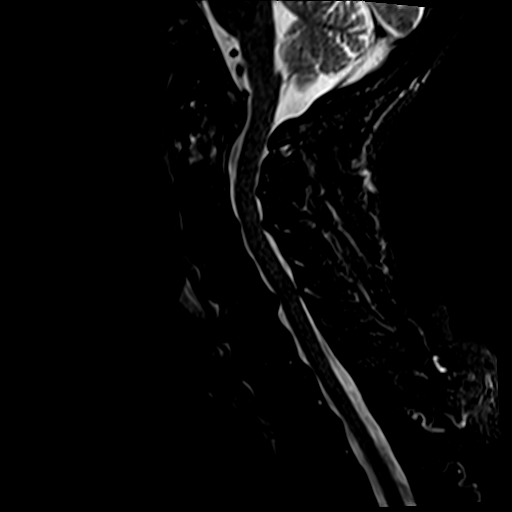
[im 13/13]
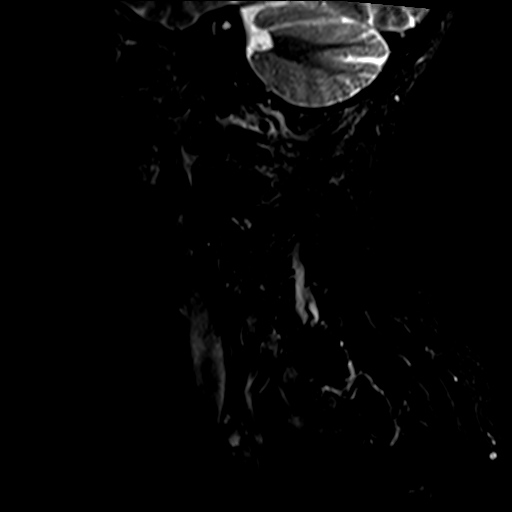

[Series 7: T2 · axial · 3.0mm · 0.70mm/px · z∈[-98,+27]mm · 10 of 38 slices shown]
[im 3/38]
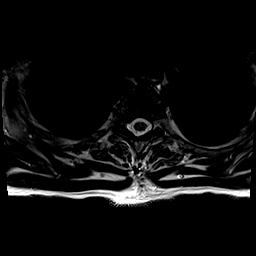
[im 5/38]
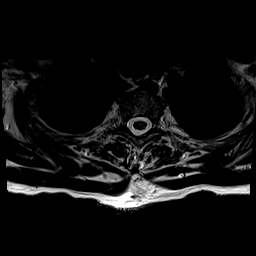
[im 8/38]
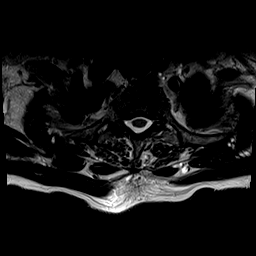
[im 13/38]
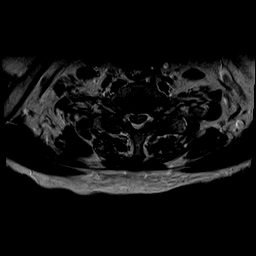
[im 18/38]
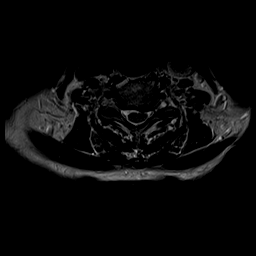
[im 20/38]
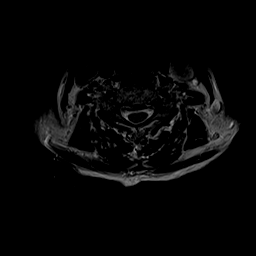
[im 23/38]
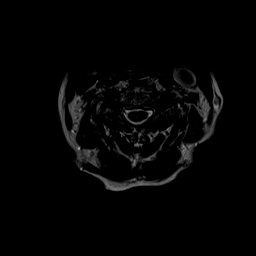
[im 28/38]
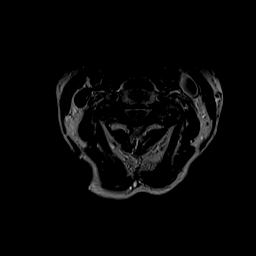
[im 33/38]
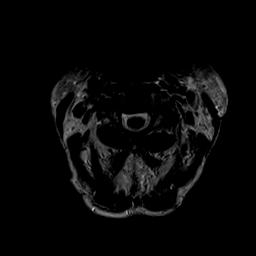
[im 38/38]
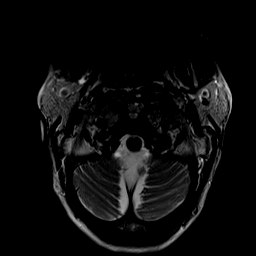

[25 of 48 positions shown; findings below may reference images not displayed]

FINDINGS: Alignment: Physiologic.

Vertebrae: No fracture, evidence of discitis, or bone lesion.

Cord: Normal signal and morphology.

Posterior Fossa, vertebral arteries, paraspinal tissues: Posterior
fossa demonstrates no focal abnormality. Vertebral artery flow voids
are maintained. Paraspinal soft tissues are unremarkable.

Disc levels:

Discs: Degenerative disc disease mild disc height loss throughout
the cervical spine most severe at C5-6.

C2-3: No significant disc bulge. Moderate bilateral foraminal
stenosis. No central canal stenosis.

C3-4: Mild broad-based disc bulge. Mild bilateral facet arthropathy.
Severe bilateral foraminal stenosis. No central canal stenosis.

C4-5: Mild broad-based disc bulge. Mild bilateral facet arthropathy.
Severe bilateral foraminal stenosis. No central canal stenosis.

C5-6: Broad-based disc bulge eccentric towards the right flattening
the ventral thecal sac. Moderate spinal stenosis. Severe bilateral
foraminal stenosis.

C6-7: Left paracentral broad disc protrusion mildly deforming the
left paracentral ventral cervical spinal cord. Mild right foraminal
stenosis. No left foraminal stenosis. No central canal stenosis.

C7-T1: Broad central disc protrusion. Moderate bilateral foraminal
stenosis. Mild bilateral facet arthropathy. No central canal
stenosis.

T1-2: Mild broad-based disc bulge. No foraminal or central canal
stenosis.
IMPRESSION: 1. Diffuse cervical spine spondylosis as described above.
2. At C3-4 there is a mild broad-based disc bulge. Mild bilateral
facet arthropathy. Severe bilateral foraminal stenosis.
3. At C4-5 there is a mild broad-based disc bulge. Mild bilateral
facet arthropathy. Severe bilateral foraminal stenosis.
4. At C5-6 there is a broad-based disc bulge eccentric towards the
right flattening the ventral thecal sac. Moderate spinal stenosis.
Severe bilateral foraminal stenosis.

## 2018-02-05 ENCOUNTER — Other Ambulatory Visit: Payer: Self-pay | Admitting: Obstetrics and Gynecology

## 2018-02-05 DIAGNOSIS — M8580 Other specified disorders of bone density and structure, unspecified site: Secondary | ICD-10-CM

## 2018-03-20 ENCOUNTER — Ambulatory Visit
Admission: RE | Admit: 2018-03-20 | Discharge: 2018-03-20 | Disposition: A | Discharge status: Home / Self Care | Payer: Medicare Other | Source: Ambulatory Visit | Attending: Obstetrics and Gynecology | Admitting: Obstetrics and Gynecology

## 2018-03-20 DIAGNOSIS — M8580 Other specified disorders of bone density and structure, unspecified site: Secondary | ICD-10-CM

## 2020-01-13 ENCOUNTER — Encounter: Payer: Self-pay | Admitting: Physical Therapy

## 2020-01-13 ENCOUNTER — Ambulatory Visit: Payer: Medicare Other | Attending: Family Medicine | Admitting: Physical Therapy

## 2020-01-13 ENCOUNTER — Other Ambulatory Visit: Payer: Self-pay

## 2020-01-13 DIAGNOSIS — M25611 Stiffness of right shoulder, not elsewhere classified: Secondary | ICD-10-CM | POA: Insufficient documentation

## 2020-01-13 DIAGNOSIS — M25511 Pain in right shoulder: Secondary | ICD-10-CM | POA: Insufficient documentation

## 2020-01-13 NOTE — Therapy (Signed)
[facility] [address] [place], [place], [address] Phone: [phone]   Fax:  [phone]  Physical Therapy Evaluation  Patient Details  Name: Misty Dodson MRN: [id number] Date of Birth: 05/31/1936 Referring Provider (PT): Bouska   Encounter Date: 01/13/2020   PT End of Session - 01/13/20 1520    Visit Number 1    Date for PT Re-Evaluation 03/14/20    PT Start Time 1436    PT Stop Time 1523    PT Time Calculation (min) 47 min    Activity Tolerance Patient limited by pain    Behavior During Therapy Anxious           Past Medical History:  Diagnosis Date   Anxiety    occasional "panic attacks"   Family history of anesthesia complication    son's "heart stopped" during knee repair age 74Yrs   GERD (gastroesophageal reflux disease)    Heart murmur    Hypertension    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    Past Surgical History:  Procedure Laterality Date   ABDOMINAL HYSTERECTOMY     ANTERIOR AND POSTERIOR REPAIR N/A 02/23/2014   Procedure: ANTERIOR (CYSTOCELE) AND POSTERIOR REPAIR (RECTOCELE);  Surgeon: Sherian Rein, MD;  Location: [facility];  Service: Gynecology;  Laterality: N/A;   BACK SURGERY     "several times"   CYSTOSCOPY N/A 02/23/2014   Procedure: CYSTOSCOPY;  Surgeon: Martina Sinner, MD;  Location: [facility];  Service: Urology;  Laterality: N/A;   FOOT SURGERY     PUBOVAGINAL SLING N/A 02/23/2014   Procedure: Leonides Grills;  Surgeon: Martina Sinner, MD;  Location: [facility];  Service: Urology;  Laterality: N/A;   TONSILLECTOMY      There were no vitals filed for this visit.    Subjective Assessment - 01/13/20 1439    Subjective She is unsure of why she started having right shoulder pain, she reports that she thinks it started about 18 months ago.  She reports difficulty sleeping, some difficulty with ROM.  She reports that she is having difficulty with some ADL's, x-rays  negative.  Had an injection but reports no better.    Limitations House hold activities    Patient Stated Goals have better ROM, sleep better, less pain    Currently in Pain? Yes    Pain Score 0-No pain    Pain Location Shoulder    Pain Orientation Right;Posterior    Pain Descriptors / Indicators Aching;Tightness    Pain Type Acute pain    Pain Radiating Towards denies    Pain Onset More than a month ago    Pain Frequency Intermittent    Aggravating Factors  pain wakes her up, difficulty sleeping on the right side, pain up to 8/10    Pain Relieving Factors not doing anything pain can be 0/10    Effect of Pain on Daily Activities limits all ADL's              [facility] PT Assessment - 01/13/20 0001      Assessment   Medical Diagnosis right adhesive capsulitis    Referring Provider (PT) Bouska    Onset Date/Surgical Date 12/13/19    Hand Dominance Right    Prior Therapy no      Precautions   Precautions None      Balance Screen   Has the patient fallen in the past 6 months No    Has the patient had a decrease  in activity level because of a fear of falling?  No    Is the patient reluctant to leave their home because of a fear of falling?  No      Home Environment   Additional Comments does housework, some yardwork and gardening, does have a 18 month old grandchild that she lifts      Prior Function   Level of Independence Independent    Vocation Retired    Leisure walks daily      Posture/Postural Control   Posture Comments fwd head, rounded shoulders      ROM / Strength   AROM / PROM / Strength AROM;PROM;Strength      AROM   Overall AROM Comments all active motions are painful and she stops the motions, there is significant crepitus    AROM Assessment Site Shoulder    Right/Left Shoulder Right    Right Shoulder Flexion 95 Degrees    Right Shoulder ABduction 60 Degrees    Right Shoulder Internal Rotation 25 Degrees    Right Shoulder External Rotation 80 Degrees       PROM   Overall PROM Comments needs a lot of cues to relax with active and PROM, tends to really elevate the shoulder up to the ear    PROM Assessment Site Shoulder    Right/Left Shoulder Right    Right Shoulder Flexion 103 Degrees    Right Shoulder ABduction 70 Degrees    Right Shoulder Internal Rotation 25 Degrees    Right Shoulder External Rotation 85 Degrees      Strength   Overall Strength Comments right shoulder in the available ROM 3/5 did not give resistance due to pain      Palpation   Palpation comment she is very tight and gaurded to palaption in the upper trap and the shoulder, she does have a very tender spot in the posterior shoulder                      Objective measurements completed on examination: See above findings.               PT Education - 01/13/20 1520    Education Details HEP for PROM of the shoulder with shrugs and scapular retraction    Person(s) Educated Patient;Spouse    Methods Explanation;Demonstration;Tactile cues;Verbal cues;Handout    Comprehension Verbalized understanding;Returned demonstration;Verbal cues required;Tactile cues required            PT Short Term Goals - 01/13/20 1623      PT SHORT TERM GOAL #1   Title independent with iniital HEP    Time 2    Period Weeks    Status New             PT Long Term Goals - 01/13/20 1623      PT LONG TERM GOAL #1   Title report 50% less pain with sleeping    Time 8    Period Weeks    Status New      PT LONG TERM GOAL #2   Title understand posture and body mechanics    Time 8    Period Weeks    Status New      PT LONG TERM GOAL #3   Title increase ROM of IR to 65 degrees    Time 8    Period Weeks    Status New      PT LONG TERM GOAL #4   Title increase AROM of  flexion to 140 degrees    Time 8    Period Weeks    Status New                  Plan - 01/13/20 1524    Clinical Impression Statement Patient reports that she started having  some difficulty with ROM and pain in the right shoulder about 18 months ago, she is unsure of a cause, reports that "just over time" the pain and the ROM got worse, she reports difficulty with ADL's. she had an injection without any relief.  She has very limited AROM and PROM, she is very guarded and apprehensive to motions.  She has a lot of spasms in the neck, shoulder and upper trap area    Stability/Clinical Decision Making Stable/Uncomplicated    Clinical Decision Making Low    Rehab Potential Good    PT Frequency 2x / week    PT Duration 8 weeks    PT Treatment/Interventions ADLs/Self Care Home Management;Cryotherapy;Electrical Stimulation;Iontophoresis 4mg /ml Dexamethasone;Moist Heat;Ultrasound;Patient/family education;Therapeutic activities;Therapeutic exercise;Manual techniques    PT Next Visit Plan slowly try to get her motion back, watch for compensation    Consulted and Agree with Plan of Care Patient           Patient will benefit from skilled therapeutic intervention in order to improve the following deficits and impairments:  Decreased range of motion, Impaired UE functional use, Increased muscle spasms, Pain, Improper body mechanics, Postural dysfunction, Decreased strength  Visit Diagnosis: Acute pain of right shoulder - Plan: PT plan of care cert/re-cert  Stiffness of right shoulder, not elsewhere classified - Plan: PT plan of care cert/re-cert     Problem List Patient Active Problem List   Diagnosis Date Noted   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

## 2020-01-20 ENCOUNTER — Ambulatory Visit: Payer: Medicare Other | Admitting: Physical Therapy

## 2020-01-20 ENCOUNTER — Other Ambulatory Visit: Payer: Self-pay

## 2020-01-20 ENCOUNTER — Encounter: Payer: Self-pay | Admitting: Physical Therapy

## 2020-01-20 DIAGNOSIS — M25511 Pain in right shoulder: Secondary | ICD-10-CM

## 2020-01-20 DIAGNOSIS — M25611 Stiffness of right shoulder, not elsewhere classified: Secondary | ICD-10-CM

## 2020-01-20 NOTE — Therapy (Signed)
[facility] [address] [place], [place], [address] Phone: [phone]   Fax:  [phone]  Physical Therapy Treatment  Patient Details  Name: Misty Dodson MRN: [id number] Date of Birth: Nov 25, 1935 Referring Provider (PT): Bouska   Encounter Date: 01/20/2020   PT End of Session - 01/20/20 1053    Visit Number 2    Date for PT Re-Evaluation 03/14/20    PT Start Time 1012    PT Stop Time 1053    PT Time Calculation (min) 41 min    Activity Tolerance Patient tolerated treatment well    Behavior During Therapy [facility] for tasks assessed/performed;Anxious           Past Medical History:  Diagnosis Date  . Anxiety    occasional "panic attacks"  . Family history of anesthesia complication    son's "heart stopped" during knee repair age 55Yrs  . GERD (gastroesophageal reflux disease)   . Heart murmur   . Hypertension   . 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 ABDOMINAL HYSTERECTOMY    . ANTERIOR AND POSTERIOR REPAIR N/A 02/23/2014   Procedure: ANTERIOR (CYSTOCELE) AND POSTERIOR REPAIR (RECTOCELE);  Surgeon: Sherian Rein, MD;  Location: [facility];  Service: Gynecology;  Laterality: N/A;  . BACK SURGERY     "several times"  . CYSTOSCOPY N/A 02/23/2014   Procedure: CYSTOSCOPY;  Surgeon: Martina Sinner, MD;  Location: [facility];  Service: Urology;  Laterality: N/A;  . FOOT SURGERY    . PUBOVAGINAL SLING N/A 02/23/2014   Procedure: Leonides Grills;  Surgeon: Martina Sinner, MD;  Location: [facility];  Service: Urology;  Laterality: N/A;  . TONSILLECTOMY      There were no vitals filed for this visit.   Subjective Assessment - 01/20/20 1014    Subjective Feels like it is getting better, can sleep on that side    Currently in Pain? Yes    Pain Score 3     Pain Location Shoulder    Pain Orientation Right;Posterior                              [facility] Adult PT Treatment/Exercise - 01/20/20 0001      Exercises   Exercises Shoulder      Shoulder Exercises: Standing   Flexion AROM;Both;10 reps    ABduction 20 reps;Both;AROM    Extension Theraband;20 reps;Strengthening;Both    Theraband Level (Shoulder Extension) Level 1 (Yellow)    Row 10 reps;5 reps;Both;Strengthening;Theraband    Theraband Level (Shoulder Row) Level 2 (Red)    Other Standing Exercises Biceps curls 3lb 2x10    Other Standing Exercises Tricept ext 10lb 2x10      Shoulder Exercises: ROM/Strengthening   UBE (Upper Arm Bike) L1 x5 min       Manual Therapy   Manual Therapy Passive ROM    Passive ROM RUE in all directions                    PT Short Term Goals - 01/13/20 1623      PT SHORT TERM GOAL #1   Title independent with iniital HEP    Time 2    Period Weeks    Status New             PT Long Term Goals - 01/13/20 1601  PT LONG TERM GOAL #1   Title report 50% less pain with sleeping    Time 8    Period Weeks    Status New      PT LONG TERM GOAL #2   Title understand posture and body mechanics    Time 8    Period Weeks    Status New      PT LONG TERM GOAL #3   Title increase ROM of IR to 65 degrees    Time 8    Period Weeks    Status New      PT LONG TERM GOAL #4   Title increase AROM of flexion to 140 degrees    Time 8    Period Weeks    Status New                 Plan - 01/20/20 1054    Clinical Impression Statement Pt able to complete al of today's interventions. She did reports some R shoulder pain with standing rows. Pt did voice that she does not like to do things that's causes pain. Postural cues needed with standing shoulder extensions. No pain reported with the rest of the interventions. Some tightness with passive shoulder flex, abd, and internal rotation.    Stability/Clinical Decision Making Stable/Uncomplicated    Rehab Potential Good    PT Frequency 2x  / week    PT Duration 8 weeks    PT Treatment/Interventions ADLs/Self Care Home Management;Cryotherapy;Electrical Stimulation;Iontophoresis 4mg /ml Dexamethasone;Moist Heat;Ultrasound;Patient/family education;Therapeutic activities;Therapeutic exercise;Manual techniques    PT Next Visit Plan slowly try to get her motion back, watch for compensation           Patient will benefit from skilled therapeutic intervention in order to improve the following deficits and impairments:  Decreased range of motion, Impaired UE functional use, Increased muscle spasms, Pain, Improper body mechanics, Postural dysfunction, Decreased strength  Visit Diagnosis: Acute pain of right shoulder  Stiffness of right shoulder, not elsewhere classified     Problem List Patient Active Problem List   Diagnosis Date Noted  . 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    04/24/2014, PTA 01/20/2020, 11:00 AM  [facility] [address] [place], [place], [place] Phone: [phone]   Fax:  [phone]  Name: [name] MRN: Misty Dodson Date of Birth: 03/25/1936

## 2020-01-26 ENCOUNTER — Other Ambulatory Visit: Payer: Self-pay

## 2020-01-26 ENCOUNTER — Encounter: Payer: Self-pay | Admitting: Physical Therapy

## 2020-01-26 ENCOUNTER — Ambulatory Visit: Payer: Medicare Other | Attending: Family Medicine | Admitting: Physical Therapy

## 2020-01-26 DIAGNOSIS — M25611 Stiffness of right shoulder, not elsewhere classified: Secondary | ICD-10-CM | POA: Insufficient documentation

## 2020-01-26 DIAGNOSIS — M25511 Pain in right shoulder: Secondary | ICD-10-CM | POA: Insufficient documentation

## 2020-01-26 NOTE — Therapy (Signed)
[facility] [address] [place], [place], [address] Phone: [phone]   Fax:  [phone]  Physical Therapy Treatment  Patient Details  Name: Misty Dodson MRN: [id number] Date of Birth: 23-Mar-1936 Referring Provider (PT): Bouska   Encounter Date: 01/26/2020   PT End of Session - 01/26/20 1102    Visit Number 3    Date for PT Re-Evaluation 03/14/20    PT Start Time 7711    PT Stop Time 1058    PT Time Calculation (min) 43 min    Activity Tolerance Patient tolerated treatment well    Behavior During Therapy [facility] for tasks assessed/performed;Anxious           Past Medical History:  Diagnosis Date  . Anxiety    occasional "panic attacks"  . Family history of anesthesia complication    son's "heart stopped" during knee repair age 29Yrs  . GERD (gastroesophageal reflux disease)   . Heart murmur   . Hypertension   . 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 ABDOMINAL HYSTERECTOMY    . ANTERIOR AND POSTERIOR REPAIR N/A 02/23/2014   Procedure: ANTERIOR (CYSTOCELE) AND POSTERIOR REPAIR (RECTOCELE);  Surgeon: Janyth Contes, MD;  Location: [facility];  Service: Gynecology;  Laterality: N/A;  . BACK SURGERY     "several times"  . CYSTOSCOPY N/A 02/23/2014   Procedure: CYSTOSCOPY;  Surgeon: Reece Packer, MD;  Location: [facility];  Service: Urology;  Laterality: N/A;  . FOOT SURGERY    . PUBOVAGINAL SLING N/A 02/23/2014   Procedure: Gaynelle Arabian;  Surgeon: Reece Packer, MD;  Location: [facility];  Service: Urology;  Laterality: N/A;  . TONSILLECTOMY      There were no vitals filed for this visit.   Subjective Assessment - 01/26/20 1014    Subjective Feeling fine overall, some soreness after last session.    Currently in Pain? No/denies              [facility] PT Assessment - 01/26/20 0001      AROM   AROM Assessment Site Shoulder    Right/Left Shoulder  Right    Right Shoulder Flexion 134 Degrees    Right Shoulder ABduction 101 Degrees    Right Shoulder Internal Rotation 54 Degrees    Right Shoulder External Rotation 90 Degrees                         [facility] Adult PT Treatment/Exercise - 01/26/20 0001      Shoulder Exercises: Standing   Horizontal ABduction Theraband;Strengthening;Both;5 reps;10 reps    Theraband Level (Shoulder Horizontal ABduction) Level 1 (Yellow)    External Rotation Theraband;20 reps;Right;Strengthening    Theraband Level (Shoulder External Rotation) Level 1 (Yellow)    Internal Rotation 20 reps;Theraband;Right;Strengthening    Theraband Level (Shoulder Internal Rotation) Level 1 (Yellow)    Flexion AROM;Both;20 reps;Strengthening    Shoulder Flexion Weight (lbs) 2    ABduction 20 reps;Both;AROM    Shoulder ABduction Weight (lbs) 1    Row 10 reps;5 reps;Both;Strengthening;Theraband    Other Standing Exercises AAROM Cane flex, Ext, IR up back       Shoulder Exercises: ROM/Strengthening   UBE (Upper Arm Bike) L2.5 x5 min       Manual Therapy   Manual Therapy Passive ROM    Passive ROM RUE in all directions  PT Short Term Goals - 01/13/20 1623      PT SHORT TERM GOAL #1   Title independent with iniital HEP    Time 2    Period Weeks    Status New             PT Long Term Goals - 01/26/20 1105      PT LONG TERM GOAL #1   Title report 50% less pain with sleeping    Status Achieved      PT LONG TERM GOAL #2   Title understand posture and body mechanics    Status Partially Met      PT LONG TERM GOAL #3   Title increase ROM of IR to 65 degrees    Status On-going      PT LONG TERM GOAL #4   Title increase AROM of flexion to 140 degrees    Status On-going                 Plan - 01/26/20 1105    Clinical Impression Statement Pt has progressed increasing her RUE AROM in all directions. She did well completing all of the interventions. No pain  reported with rows today's, some difficulty when attempting 2lb shoulder abduction so 1lb was selected without issue. Some pain at the end range of passive motions.    Stability/Clinical Decision Making Stable/Uncomplicated    Rehab Potential Good    PT Frequency 2x / week    PT Duration 8 weeks    PT Treatment/Interventions ADLs/Self Care Home Management;Cryotherapy;Electrical Stimulation;Iontophoresis 39m/ml Dexamethasone;Moist Heat;Ultrasound;Patient/family education;Therapeutic activities;Therapeutic exercise;Manual techniques    PT Next Visit Plan slowly try to get her motion back, watch for compensation           Patient will benefit from skilled therapeutic intervention in order to improve the following deficits and impairments:  Decreased range of motion, Impaired UE functional use, Increased muscle spasms, Pain, Improper body mechanics, Postural dysfunction, Decreased strength  Visit Diagnosis: Acute pain of right shoulder  Stiffness of right shoulder, not elsewhere classified     Problem List Patient Active Problem List   Diagnosis Date Noted  . 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    RScot Jun PTA 01/26/2020, 11:11 AM  [place] [address]Phone: [phone]  Fax:  [phone] Name: Misty WARDROPMRN: [id number] of Birth: [birth year]

## 2020-02-09 ENCOUNTER — Ambulatory Visit: Payer: Medicare Other | Admitting: Physical Therapy

## 2020-02-16 ENCOUNTER — Ambulatory Visit: Payer: Medicare Other | Admitting: Physical Therapy

## 2020-02-23 ENCOUNTER — Encounter: Payer: Self-pay | Admitting: Physical Therapy

## 2020-02-23 ENCOUNTER — Other Ambulatory Visit: Payer: Self-pay

## 2020-02-23 ENCOUNTER — Ambulatory Visit: Payer: Medicare Other | Attending: Family Medicine | Admitting: Physical Therapy

## 2020-02-23 DIAGNOSIS — M25611 Stiffness of right shoulder, not elsewhere classified: Secondary | ICD-10-CM | POA: Diagnosis present

## 2020-02-23 DIAGNOSIS — M25511 Pain in right shoulder: Secondary | ICD-10-CM | POA: Diagnosis present

## 2020-02-23 NOTE — Therapy (Signed)
[place], [place], [address] Phone: [phone]   Fax:  [phone]  Physical Therapy Treatment  Patient Details  Name: Misty Dodson MRN: [id number] Date of Birth: 1936/03/30 Referring Provider (PT): Coletta Memos   Encounter Date: 02/23/2020   PT End of Session - 02/23/20 0923    Visit Number 4    Date for PT Re-Evaluation 03/14/20    PT Start Time 0045    PT Stop Time 0924    PT Time Calculation (min) 519 min    Activity Tolerance Patient tolerated treatment well    Behavior During Therapy [facility] for tasks assessed/performed;Anxious           Past Medical History:  Diagnosis Date  . Anxiety    occasional "panic attacks"  . Family history of anesthesia complication    son's "heart stopped" during knee repair age 68Yrs  . GERD (gastroesophageal reflux disease)   . Heart murmur   . Hypertension   . 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 ABDOMINAL HYSTERECTOMY    . ANTERIOR AND POSTERIOR REPAIR N/A 02/23/2014   Procedure: ANTERIOR (CYSTOCELE) AND POSTERIOR REPAIR (RECTOCELE);  Surgeon: Janyth Contes, MD;  Location: [facility];  Service: Gynecology;  Laterality: N/A;  . BACK SURGERY     "several times"  . CYSTOSCOPY N/A 02/23/2014   Procedure: CYSTOSCOPY;  Surgeon: Reece Packer, MD;  Location: [facility];  Service: Urology;  Laterality: N/A;  . FOOT SURGERY    . PUBOVAGINAL SLING N/A 02/23/2014   Procedure: Gaynelle Arabian;  Surgeon: Reece Packer, MD;  Location: [facility];  Service: Urology;  Laterality: N/A;  . TONSILLECTOMY      There were no vitals filed for this visit.   Subjective Assessment - 02/23/20 0847    Subjective Pateint reports about the same, reports pain with lying on the right side, reports no difficulty with dressing and doing hair    Currently in Pain? No/denies    Aggravating Factors  lying on the right side  increases pain to 6/10              [facility] PT Assessment - 02/23/20 0001      AROM   Right Shoulder Flexion 139 Degrees    Right Shoulder ABduction 120 Degrees    Right Shoulder Internal Rotation 55 Degrees    Right Shoulder External Rotation 90 Degrees      PROM   Right Shoulder Flexion 143 Degrees    Right Shoulder Internal Rotation 75 Degrees                         [facility] Adult PT Treatment/Exercise - 02/23/20 0001      Manual Therapy   Manual Therapy Joint mobilization;Passive ROM    Joint Mobilization all GH joint mobs gentle. some manual joint distraction in all planes of motion and with PROM    Passive ROM RUE in all directions, worked a lot on contract relax in all motions                    PT Short Term Goals - 01/13/20 1623      PT SHORT TERM GOAL #1   Title independent with iniital HEP    Time 2    Period Weeks    Status New  PT Long Term Goals - 02/23/20 5053      PT LONG TERM GOAL #1   Title report 50% less pain with sleeping    Status Achieved      PT LONG TERM GOAL #2   Title understand posture and body mechanics    Status Achieved      PT LONG TERM GOAL #3   Title increase ROM of IR to 65 degrees    Status Partially Met      PT LONG TERM GOAL #4   Title increase AROM of flexion to 140 degrees    Status Partially Met                 Plan - 02/23/20 0924    Clinical Impression Statement Patient has made preogress in her AROM and PROM.  She really feels like she wants to focus on the PROM and the stretching.  She did great with the joint mobs and the contract relax, she is worried about sleeping though and reports that funtionally she is doing well    PT Next Visit Plan focus on PRM    Consulted and Agree with Plan of Care Patient           Patient will benefit from skilled therapeutic intervention in order to improve the following deficits and impairments:  Decreased range of motion,  Impaired UE functional use, Increased muscle spasms, Pain, Improper body mechanics, Postural dysfunction, Decreased strength  Visit Diagnosis: Acute pain of right shoulder  Stiffness of right shoulder, not elsewhere classified     Problem List Patient Active Problem List   Diagnosis Date Noted  . 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    Sumner Boast., PT 02/23/2020, 9:28 AM  [facility] [address] [place], [place], [address] Phone: [phone]   Fax:  [phone]  Name: Misty Dodson MRN: [id number] Date of Birth: 09/18/1935

## 2020-03-01 ENCOUNTER — Encounter: Payer: Medicare Other | Admitting: Physical Therapy

## 2020-03-15 ENCOUNTER — Ambulatory Visit: Payer: Medicare Other | Admitting: Physical Therapy

## 2020-03-15 ENCOUNTER — Other Ambulatory Visit: Payer: Self-pay

## 2020-03-15 ENCOUNTER — Encounter: Payer: Self-pay | Admitting: Physical Therapy

## 2020-03-15 DIAGNOSIS — M25511 Pain in right shoulder: Secondary | ICD-10-CM | POA: Diagnosis not present

## 2020-03-15 DIAGNOSIS — M25611 Stiffness of right shoulder, not elsewhere classified: Secondary | ICD-10-CM

## 2020-03-15 NOTE — Therapy (Signed)
[place], [place], [address] Phone: [phone]   Fax:  [phone]  Physical Therapy Treatment  Patient Details  Name: CODIE HAINER MRN: [id number] Date of Birth: August 22, 1935 Referring Provider (PT): Bouska   Encounter Date: 03/15/2020   PT End of Session - 03/15/20 1040    Visit Number 5    PT Start Time 1006    PT Stop Time 1048    PT Time Calculation (min) 42 min    Activity Tolerance Patient tolerated treatment well    Behavior During Therapy [facility] for tasks assessed/performed           Past Medical History:  Diagnosis Date  . Anxiety    occasional "panic attacks"  . Family history of anesthesia complication    son's "heart stopped" during knee repair age 60Yrs  . GERD (gastroesophageal reflux disease)   . Heart murmur   . Hypertension   . 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 ABDOMINAL HYSTERECTOMY    . ANTERIOR AND POSTERIOR REPAIR N/A 02/23/2014   Procedure: ANTERIOR (CYSTOCELE) AND POSTERIOR REPAIR (RECTOCELE);  Surgeon: Janyth Contes, MD;  Location: [facility];  Service: Gynecology;  Laterality: N/A;  . BACK SURGERY     "several times"  . CYSTOSCOPY N/A 02/23/2014   Procedure: CYSTOSCOPY;  Surgeon: Reece Packer, MD;  Location: [facility];  Service: Urology;  Laterality: N/A;  . FOOT SURGERY    . PUBOVAGINAL SLING N/A 02/23/2014   Procedure: Gaynelle Arabian;  Surgeon: Reece Packer, MD;  Location: [facility];  Service: Urology;  Laterality: N/A;  . TONSILLECTOMY      There were no vitals filed for this visit.   Subjective Assessment - 03/15/20 1018    Subjective Patient reports was on vacation 4-5 days, reports feeling good, noissues    Currently in Pain? No/denies              [facility] PT Assessment - 03/15/20 0001      AROM   Right Shoulder Flexion 140 Degrees    Right Shoulder ABduction 120 Degrees     Right Shoulder Internal Rotation 55 Degrees    Right Shoulder External Rotation 90 Degrees                         [facility] Adult PT Treatment/Exercise - 03/15/20 0001      Manual Therapy   Manual Therapy Joint mobilization;Passive ROM    Joint Mobilization all GH joint mobs gentle. some manual joint distraction in all planes of motion and with PROM    Passive ROM RUE in all directions, worked a lot on contract relax in all motions                  PT Education - 03/15/20 1039    Education Details reviewed HEP, added doorway and corner stretch, talked about frozen shoulder and to stay active and use the arm    Person(s) Educated Patient    Methods Explanation;Demonstration    Comprehension Verbalized understanding;Returned demonstration            PT Short Term Goals - 01/13/20 1623      PT SHORT TERM GOAL #1   Title independent with iniital HEP    Time 2    Period Weeks    Status New  PT Long Term Goals - 03/15/20 1142      PT LONG TERM GOAL #1   Title report 50% less pain with sleeping    Status Achieved      PT LONG TERM GOAL #2   Title understand posture and body mechanics    Status Achieved      PT LONG TERM GOAL #3   Title increase ROM of IR to 65 degrees    Status Achieved      PT LONG TERM GOAL #4   Title increase AROM of flexion to 140 degrees    Status Achieved                 Plan - 03/15/20 1041    Clinical Impression Statement Over the past 3 weeks patient has maintained her ROM, reports sleeping better and no difficulty with ADL's, reports no pain with ADL's just painful at the end range.  I went over her HEP and how to assure that she maintain her current motion and not to have a set back, encouraged her to use the arm    PT Next Visit Plan d/c goals met    Consulted and Agree with Plan of Care Patient           Patient will benefit from skilled therapeutic intervention in order to improve the  following deficits and impairments:  Decreased range of motion, Impaired UE functional use, Increased muscle spasms, Pain, Improper body mechanics, Postural dysfunction, Decreased strength  Visit Diagnosis: Acute pain of right shoulder  Stiffness of right shoulder, not elsewhere classified     Problem List Patient Active Problem List   Diagnosis Date Noted  . 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    Sumner Boast., PT 03/15/2020, 11:42 AM  [place] [address] [place], [place], [address] Phone: [phone]   Fax:  [phone]  Name: ALESI ZACHERY MRN: [id number] Date of Birth: [birth year]

## 2020-03-22 ENCOUNTER — Ambulatory Visit: Payer: Medicare Other | Admitting: Physical Therapy

## 2021-06-19 ENCOUNTER — Encounter: Payer: Self-pay | Admitting: Neurology

## 2021-07-03 ENCOUNTER — Other Ambulatory Visit: Payer: Self-pay

## 2021-07-03 DIAGNOSIS — R202 Paresthesia of skin: Secondary | ICD-10-CM

## 2021-07-04 ENCOUNTER — Ambulatory Visit: Payer: Medicare Other | Admitting: Neurology

## 2021-07-04 ENCOUNTER — Other Ambulatory Visit: Payer: Self-pay

## 2021-07-04 DIAGNOSIS — R202 Paresthesia of skin: Secondary | ICD-10-CM | POA: Diagnosis not present

## 2021-07-04 DIAGNOSIS — G5602 Carpal tunnel syndrome, left upper limb: Secondary | ICD-10-CM

## 2021-07-04 DIAGNOSIS — M5412 Radiculopathy, cervical region: Secondary | ICD-10-CM

## 2021-07-04 NOTE — Procedures (Signed)
[facility]  [address] [place], [address]  [place], [place] [address] Tel: [phone] Fax:  [phone] Test Date:  07/04/2021  Patient: Misty Dodson DOB: [birth year] Physician: Nita Sickle, DO  Sex: Female Height: 5\' 7"  Ref Phys: , MD  ID#: [id number]   Technician:    Patient Complaints: This is a 85 year old female referred for evaluation of bilateral arm paresthesias and pain.  NCV & EMG Findings: Extensive electrodiagnostic testing of the left upper extremity and additional studies of the right shows:  Left median sensory response is absent.  Right median, right mixed palmar, and bilateral ulnar sensory responses are within normal limits. Left median motor response shows prolonged latency (5.7 ms) and reduced amplitude (4.2 mV).  Right median and bilateral ulnar motor responses are within normal limits.   Chronic motor axonal loss changes are seen affecting bilateral C7 and C8 myotomes as well as the left abductor pollicis brevis muscle.  There is no evidence of accompanied active denervation.  Impression: Left median neuropathy at or distal to the wrist, consistent with a clinical diagnosis of carpal tunnel syndrome.  Overall, these findings are severe in degree electrically. Chronic C7-8 radiculopathy affecting bilateral upper extremities, moderate.   ___________________________ 91, DO    Nerve Conduction Studies Anti Sensory Summary Table   Stim Site NR Peak (ms) Norm Peak (ms) P-T Amp (V) Norm P-T Amp  Left Median Anti Sensory (2nd Digit)  33C  Wrist NR  <3.8  >10  Right Median Anti Sensory (2nd Digit)  33C  Wrist    3.7 <3.8 14.9 >10  Left Ulnar Anti Sensory (5th Digit)  33C  Wrist    3.1 <3.2 18.8 >5  Right Ulnar Anti Sensory (5th Digit)  33C  Wrist    2.8 <3.2 16.5 >5   Motor Summary Table   Stim Site NR Onset (ms) Norm Onset (ms) O-P Amp (mV) Norm O-P Amp Site1 Site2 Delta-0 (ms) Dist (cm) Vel (m/s) Norm Vel (m/s)  Left  Median Motor (Abd Poll Brev)  33C  Wrist    5.7 <4.0 4.2 >5 Elbow Wrist 5.6 28.0 50 >50  Elbow    11.3  3.9         Right Median Motor (Abd Poll Brev)  33C  Wrist    3.3 <4.0 5.4 >5 Elbow Wrist 5.8 30.0 52 >50  Elbow    9.1  4.8         Left Ulnar Motor (Abd Dig Minimi)  33C  Wrist    2.2 <3.1 8.9 >7 B Elbow Wrist 3.8 21.0 55 >50  B Elbow    6.0  8.1  A Elbow B Elbow 1.9 10.0 53 >50  A Elbow    7.9  7.8         Right Ulnar Motor (Abd Dig Minimi)  33C  Wrist    2.2 <3.1 8.7 >7 B Elbow Wrist 3.7 22.0 59 >50  B Elbow    5.9  7.6  A Elbow B Elbow 1.8 10.0 56 >50  A Elbow    7.7  6.6          Comparison Summary Table   Stim Site NR Peak (ms) Norm Peak (ms) P-T Amp (V) Site1 Site2 Delta-P (ms) Norm Delta (ms)  Right Median/Ulnar Palm Comparison (Wrist - 8cm)  33C  Median Palm    2.1 <2.2 38.5 Median Palm Ulnar Palm 0.3   Ulnar Palm    1.8 <2.2 20.5  EMG   Side Muscle Ins Act Fibs Psw Fasc Number Recrt Dur Dur. Amp Amp. Poly Poly. Comment  Right 1stDorInt Nml Nml Nml Nml 1- Rapid Some 1+ Some 1+ Some 1+ N/A  Right Triceps Nml Nml Nml Nml 1- Rapid Some 1+ Some 1+ Some 1+ N/A  Right Ext Indicis Nml Nml Nml Nml 1- Rapid Some 1+ Some 1+ Some 1+ N/A  Right PronatorTeres Nml Nml Nml Nml 1- Rapid Few 1+ Few 1+ Few 1+ N/A  Right Biceps Nml Nml Nml Nml Nml Nml Nml Nml Nml Nml Nml Nml N/A  Right Deltoid Nml Nml Nml Nml Nml Nml Nml Nml Nml Nml Nml Nml N/A  Left 1stDorInt Nml Nml Nml Nml 1- Rapid Some 1+ Some 1+ Some 1+ N/A  Left Abd Poll Brev Nml Nml Nml Nml 2- Rapid Many 1+ Many 1+ Many 1+ N/A  Left Ext Indicis Nml Nml Nml Nml 1- Rapid Some 1+ Some 1+ Some 1+ N/A  Left PronatorTeres Nml Nml Nml Nml 1- Rapid Few 1+ Few 1+ Few 1+ N/A  Left Biceps Nml Nml Nml Nml Nml Nml Nml Nml Nml Nml Nml Nml N/A  Left Triceps Nml Nml Nml Nml 1- Rapid Some 1+ Some 1+ Some 1+ N/A  Left Deltoid Nml Nml Nml Nml Nml Nml Nml Nml Nml Nml Nml Nml N/A      Waveforms:

## 2021-10-21 ENCOUNTER — Encounter (HOSPITAL_BASED_OUTPATIENT_CLINIC_OR_DEPARTMENT_OTHER): Payer: Self-pay | Admitting: Emergency Medicine

## 2021-10-21 ENCOUNTER — Other Ambulatory Visit: Payer: Self-pay

## 2021-10-21 ENCOUNTER — Emergency Department (HOSPITAL_BASED_OUTPATIENT_CLINIC_OR_DEPARTMENT_OTHER)
Admission: EM | Admit: 2021-10-21 | Discharge: 2021-10-21 | Disposition: A | Discharge status: Home / Self Care | Payer: Medicare Other | Attending: Emergency Medicine | Admitting: Emergency Medicine

## 2021-10-21 DIAGNOSIS — T162XXA Foreign body in left ear, initial encounter: Secondary | ICD-10-CM | POA: Insufficient documentation

## 2021-10-21 DIAGNOSIS — X58XXXA Exposure to other specified factors, initial encounter: Secondary | ICD-10-CM | POA: Diagnosis not present

## 2021-10-21 NOTE — ED Triage Notes (Addendum)
Hearing aide broke off in patient's ear 1 hr ago. Denies pain. Able to visualize end of device externally in triage ? ?

## 2021-10-21 NOTE — ED Provider Notes (Signed)
?[facility] ?Provider Note ? ? ?CSN: [id number] ?Arrival date & time: 10/21/21  1924 ? ?  ? ?History ? ?Chief Complaint  ?Patient presents with  ? Foreign Body in [place]  ? ? ?Misty Dodson is a 86 y.o. female. ? ?Patient presents ER complaining of foreign body in left ear.  She states that her hearing aid broke off in her left ear.  She tried to get it out with some tweezers today but were unable to do so and presents to the ER.  Otherwise denies any pain no fever no cough no vomiting no diarrhea.  No ear pain reported. ? ? ?  ? ?Home Medications ?Prior to Admission medications   ?Medication Sig Start Date End Date Taking? Authorizing Provider  ?acetaminophen (TYLENOL) 500 MG tablet Take 500 mg by mouth every 6 (six) hours as needed for mild pain or headache. For pain/headache    [provider]  ?celecoxib (CELEBREX) 200 MG capsule Take 200 mg by mouth daily.    [provider]  ?ciprofloxacin (CIPRO) 250 MG tablet Take 1 tablet (250 mg total) by mouth 2 (two) times daily. 02/24/14   Bjorn Loser, MD  ?esomeprazole (NEXIUM) 20 MG capsule Take 20 mg by mouth daily at 12 noon.    [provider]  ?gabapentin (NEURONTIN) 300 MG capsule Take 300 mg by mouth 2 (two) times daily.    [provider]  ?ibuprofen (ADVIL,MOTRIN) 800 MG tablet Take 1 tablet (800 mg total) by mouth every 8 (eight) hours as needed (mild pain). 02/24/14   [name], Jeral Fruit, MD  ?Melatonin 10 MG CAPS Take 1 capsule by mouth at bedtime as needed (For sleep.).    [provider]  ?oxyCODONE-acetaminophen (PERCOCET/ROXICET) 5-325 MG per tablet Take 1-2 tablets by mouth every 6 (six) hours as needed for severe pain (moderate to severe pain (when tolerating fluids)). 02/24/14   [name], Jeral Fruit, MD  ?polyethylene glycol (MIRALAX / GLYCOLAX) packet Take 17 g by mouth daily. 02/24/14   [name], Jeral Fruit, MD  ?propranolol (INDERAL) 10 MG tablet Take 10 mg by mouth daily.     [provider]  ?zolpidem (AMBIEN) 10 MG tablet Take 5 mg by mouth at bedtime as needed for sleep.    [provider]  ?   ? ?Allergies    ?Patient has no known allergies.   ? ?Review of Systems   ?Review of Systems  ?Constitutional:  Negative for fever.  ?HENT:  Negative for ear pain.   ?Eyes:  Negative for pain.  ?Respiratory:  Negative for cough.   ?Cardiovascular:  Negative for chest pain.  ?Gastrointestinal:  Negative for abdominal pain.  ?Genitourinary:  Negative for flank pain.  ?Musculoskeletal:  Negative for back pain.  ?Skin:  Negative for rash.  ?Neurological:  Negative for headaches.  ? ?Physical Exam ?Updated Vital Signs ?BP (!) 171/86   Pulse 72   Temp 98.3 ?F (36.8 ?C) (Oral)   Resp 16   Wt 56.7 kg   SpO2 95%   BMI 19.58 kg/m?  ?Physical Exam ?Constitutional:   ?   General: She is not in acute distress. ?   Appearance: Normal appearance.  ?HENT:  ?   Head: Normocephalic.  ?   Ears:  ?   Comments: Broken hearing aid seen in the left ear canal. ?   Nose: Nose normal.  ?Eyes:  ?   Extraocular Movements: Extraocular movements intact.  ?Cardiovascular:  ?   Rate and Rhythm: Normal rate.  ?  Pulmonary:  ?   Effort: Pulmonary effort is normal.  ?Musculoskeletal:     ?   General: Normal range of motion.  ?   Cervical back: Normal range of motion.  ?Neurological:  ?   General: No focal deficit present.  ?   Mental Status: She is alert. Mental status is at baseline.  ? ? ?ED Results / Procedures / Treatments   ?Labs ?(all labs ordered are listed, but only abnormal results are displayed) ?Labs Reviewed - No data to display ? ?EKG ?None ? ?Radiology ?No results found. ? ?Procedures ?Procedures  ? ? ?Medications Ordered in ED ?Medications - No data to display ? ?ED Course/ Medical Decision Making/ A&P ?  ?                        ?Medical Decision Making ? ?Foreign body removed with forceps.  Removed without trauma to the canal.  Patient Toller procedure well.  No additional foreign body  noted. ? ?Vies outpatient follow-up with her doctor as needed this week.  Advised return for worsening symptoms or any additional concerns. ? ? ? ? ? ? ? ?Final Clinical Impression(s) / ED Diagnoses ?Final diagnoses:  ?Foreign body of left ear, initial encounter  ? ? ?Rx / DC Orders ?ED Discharge Orders   ? ? None  ? ?  ? ? ?  ?Luna Fuse, MD ?10/23/21 1722 ? ?

## 2021-10-21 NOTE — Discharge Instructions (Signed)
Follow-up with ENT as needed within the week. ? ?Return to the ER if you have fevers vomiting pain or any additional concerns. ?

## 2023-02-06 ENCOUNTER — Encounter: Payer: Self-pay | Admitting: Physical Therapy

## 2023-02-06 ENCOUNTER — Ambulatory Visit: Payer: Medicare Other | Admitting: Rehabilitative and Restorative Service Providers"

## 2023-02-06 ENCOUNTER — Other Ambulatory Visit: Payer: Self-pay

## 2023-02-06 ENCOUNTER — Ambulatory Visit: Payer: Medicare Other | Attending: Sports Medicine | Admitting: Physical Therapy

## 2023-02-06 DIAGNOSIS — M6281 Muscle weakness (generalized): Secondary | ICD-10-CM | POA: Diagnosis present

## 2023-02-06 DIAGNOSIS — R262 Difficulty in walking, not elsewhere classified: Secondary | ICD-10-CM | POA: Diagnosis present

## 2023-02-06 DIAGNOSIS — M25561 Pain in right knee: Secondary | ICD-10-CM | POA: Diagnosis present

## 2023-02-06 DIAGNOSIS — R600 Localized edema: Secondary | ICD-10-CM | POA: Diagnosis present

## 2023-02-06 DIAGNOSIS — M25661 Stiffness of right knee, not elsewhere classified: Secondary | ICD-10-CM | POA: Insufficient documentation

## 2023-02-06 NOTE — Therapy (Signed)
OUTPATIENT PHYSICAL THERAPY LOWER EXTREMITY EVALUATION   Patient Name: Misty Dodson MRN: [id number] DOB:1935/10/12, 87 y.o., female Today's Date: 02/06/2023  END OF SESSION:  PT End of Session - 02/06/23 1223     Visit Number 1    Date for PT Re-Evaluation 04/03/23    Authorization Type BCBS Medicare    PT Start Time 1230    PT Stop Time 1315    PT Time Calculation (min) 45 min    Activity Tolerance Patient tolerated treatment well             Past Medical History:  Diagnosis Date   Anxiety    occasional "panic attacks"   Family history of anesthesia complication    son's "heart stopped" during knee repair age 70Yrs   GERD (gastroesophageal reflux disease)    Heart murmur    Hypertension    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   Past Surgical History:  Procedure Laterality Date   ABDOMINAL HYSTERECTOMY     ANTERIOR AND POSTERIOR REPAIR N/A 02/23/2014   Procedure: ANTERIOR (CYSTOCELE) AND POSTERIOR REPAIR (RECTOCELE);  Surgeon: Sherian Rein, MD;  Location: [facility];  Service: Gynecology;  Laterality: N/A;   BACK SURGERY     "several times"   CYSTOSCOPY N/A 02/23/2014   Procedure: CYSTOSCOPY;  Surgeon: Martina Sinner, MD;  Location: [facility];  Service: Urology;  Laterality: N/A;   FOOT SURGERY     PUBOVAGINAL SLING N/A 02/23/2014   Procedure: Leonides Grills;  Surgeon: Martina Sinner, MD;  Location: [facility];  Service: Urology;  Laterality: N/A;   TONSILLECTOMY     Patient Active Problem List   Diagnosis Date Noted   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    PCP: [name] MD  REFERRING PROVIDER: [name] MD  REFERRING DIAG: [phone] s/p TKR right  THERAPY DIAG:  Right knee pain; right knee stiffness; weakness; difficulty in walking Rationale for Evaluation and Treatment: Rehabilitation  ONSET DATE: October 2023 injury, TKR 11/06/22  SUBJECTIVE:   SUBJECTIVE STATEMENT: Had right TKR  4/16; Using straight  cane (uses minimally at home);  had a fall 1 1/2 weeks ago stepping off the curb.  Feels unstable.  I feel like I'm going backwards in getting better.  Had 22 sessions of PT at [facility] stopped mid June and not doing the HEP anymore (got bored with it);  still having constant pain medial and lateral knee and lower present even with sleeping;  no recent x-rays  Able to walk 1 mile with the dog (off leash) Here with her husband (previous pt at [place]) Upcoming trips: [facility] in October Vacation to [facility] Sept 5th Gardener (townhouse) I'm afraid I couldn't get back up from the ground Dtr lives in [place]. [place] Go[place] by "[name]" PERTINENT HISTORY: HTN; GERD Right shoulder replacement PAIN:  Are you having pain? Yes NPRS scale: 6-7/10 Pain location: right medial knee and lateral knee to lower leg Aggravating factors: sit to stand; turning in bed; in/out of car Relieving factors: no pain with sitting  PRECAUTIONS: Fall     WEIGHT BEARING RESTRICTIONS: No  FALLS:  Has patient fallen in last 6 months? Yes. Number of falls 1  LIVING ENVIRONMENT: Lives with: lives with husband Lives in: House/apartment Stairs: main living and bedroom downstairs; husband uses the space upstairs more than she does   OCCUPATION: retired  PLOF: Independent  PATIENT GOALS: I want this knee to quit hurting when I get up and down; I  want it to feel stronger    OBJECTIVE:   DIAGNOSTIC FINDINGS: right knee OA  Add on eval-not in FOTO  COGNITION: Overall cognitive status: Within functional limits for tasks assessed      EDEMA:  Mild anterior edema and puffiness posterior knee Incision appears well healed Patellar hypomobility in all directions  LOWER EXTREMITY ROM: Seated active right knee ROM:  17-110 degrees Supine active right knee ROM 15-115 degrees  (left 0-150 degrees) Decreased right HS muscle length  LOWER EXTREMITY MMT: + extension lag with SLR and LAQ Unable to SLS on right without bil  UE support (left side 10 sec without UE support) Able to rise sit to stand without UE support but significant weight shift towards the left  MMT Right eval Left eval  Hip flexion 5 5  Hip extension 4 5  Hip abduction 4 5  Hip adduction    Hip internal rotation    Hip external rotation    Knee flexion 4 5  Knee extension 4- 5  Ankle dorsiflexion    Ankle plantarflexion    Ankle inversion    Ankle eversion     (Blank rows = not tested)  FUNCTIONAL TESTS:  5x sit to stand: no hands but decreased WB on right 16.57 TUG with no hands, SPC 11.61 complains of pain upon rising 1st few steps  GAIT:  Comments: pt using SPC on surgical side;  discussed using in hand on non-affected side for optimal safety (history of knee instability) Up and down 4 steps reciprocally but needs bil railings for support   TODAY'S TREATMENT:                                                                                                                              DATE: 7/17  Discussed importance of continued HEP  PATIENT EDUCATION:  Education details: Educated patient on anatomy and physiology of current symptoms, prognosis, plan of care as well as initial self care strategies to promote recovery Person educated: Patient Education method: Explanation Education comprehension: verbalized understanding  HOME EXERCISE PROGRAM: Discussed emphasis on achieving full knee extension ROM:  suggested every 2 hours help the knee get as straight as possible for 2-3 min using hands or a weight on thigh Will start a progressive HEP appropriate for her current level but has a HEP from previous facility  ASSESSMENT:  CLINICAL IMPRESSION: Patient is a 87 y.o. female who was seen today for physical therapy evaluation and treatment for s/p right TKR 4/16.  The patient had 22 visits at another PT facility and was discharged in June.  She states she eventually stopped doing her HEP.  She complains of constant knee pain on  medial aspect of her knee, lateral knee extending to her lateral lower leg.  She reports her knee feels unstable and about 1 1/2 weeks ago she was coming off a curb in a restaurant parking lot and fell.  3 people helped  her up.  She is using a SPC and was instructed in using in her left hand rather than right for more support/stability.  She has been able to walk her dog (off leash) for a mile.  She demonstrates decreased knee flexion and particularly extension range of motion as well as patellar hypmobility in all directions.   (Active ROM: 17-110 degrees) + quad lag noted.  Strength deficits and asymmetry with opposite knee include decreased quad strength with compensatory shifting away with sit to stand and lack of full weight acceptance with single leg standing.  These deficits contribute to continued pain and cause functional impairments with sit to stand, getting in/out of the car  going up and down curbs and steps. She enjoys gardening but does not feel she would be able to get back up from the ground.  She has upcoming travel plans and would need further ROM, agility, balance and strength to prepare for the physical demands of these trips.   OBJECTIVE IMPAIRMENTS: decreased activity tolerance, decreased balance, decreased mobility, difficulty walking, decreased ROM, decreased strength, increased edema, impaired perceived functional ability, impaired flexibility, and pain.   ACTIVITY LIMITATIONS: lifting, bending, standing, squatting, sleeping, stairs, transfers, bed mobility, and locomotion level  PARTICIPATION LIMITATIONS: shopping, community activity, and yard work  PERSONAL FACTORS: Good general health positively affect patient's functional outcome.   REHAB POTENTIAL: Good  CLINICAL DECISION MAKING: Stable/uncomplicated  EVALUATION COMPLEXITY: Low   GOALS: Goals reviewed with patient? Yes  SHORT TERM GOALS: Target date:03/06/2023   The patient will demonstrate knowledge of basic self  care strategies and exercises to promote healing  Baseline: Goal status: INITIAL  2.  Right knee extension to 10 degrees needed for a normalized, more efficient gait pattern Baseline:  Goal status: INITIAL  3.  Right knee flexion to 125 degrees for greater ease descending steps Baseline:  Goal status: INITIAL  4.  Improved quad strength to 4/5 needed for sit to stand transitions and in/out of the car with less pain Baseline:  Goal status: INITIAL  5.  5x sit to stand improved to 14.5 sec Baseline:  Goal status: INITIAL   LONG TERM GOALS: Target date: 04/03/2023  The patient will be independent in a safe self progression of a home exercise program to promote further recovery of function and relieve pain Baseline:  Goal status: INITIAL  2.  Right knee flexion 130 degrees needed for stooping for taking care of her dog and ease with descending curbs/steps Baseline:  Goal status: INITIAL  3.  Right knee extension to 6 degrees needed for balance/agility and gait safety for longer distance walking Baseline:  Goal status: INITIAL  4.  Right LE strength grossly 4+/5 needed for full weight acceptance when negotiating curbs, steps and with sit to stand transitions Baseline:  Goal status: INITIAL  5.  The patient will be able to get up off the floor/ground for gardening Baseline:  Goal status: INITIAL    PLAN:  PT FREQUENCY: 2x/week  PT DURATION: 8 weeks  PLANNED INTERVENTIONS: Therapeutic exercises, Therapeutic activity, Neuromuscular re-education, Balance training, Gait training, Patient/Family education, Self Care, Joint mobilization, Aquatic Therapy, Electrical stimulation, Cryotherapy, Moist heat, Taping, Vasopneumatic device, Manual therapy, and Re-evaluation  PLAN FOR NEXT SESSION: Bike for ROM for both knee flexion and extension;  give specific ex's for HEP on knee extension ROM and emphasize quad strengthening; progressive proprioceptive ex's for full WB on right;  vasocompression as needed for pain/edema control  Lavinia Sharps, PT 02/06/23 8:13 PM Phone:  [phone] Fax: [phone]

## 2023-02-08 ENCOUNTER — Ambulatory Visit: Payer: Medicare Other | Admitting: Physical Therapy

## 2023-02-08 DIAGNOSIS — M25661 Stiffness of right knee, not elsewhere classified: Secondary | ICD-10-CM

## 2023-02-08 DIAGNOSIS — R262 Difficulty in walking, not elsewhere classified: Secondary | ICD-10-CM

## 2023-02-08 DIAGNOSIS — M25561 Pain in right knee: Secondary | ICD-10-CM

## 2023-02-08 DIAGNOSIS — M6281 Muscle weakness (generalized): Secondary | ICD-10-CM

## 2023-02-08 DIAGNOSIS — R600 Localized edema: Secondary | ICD-10-CM

## 2023-02-08 NOTE — Therapy (Signed)
OUTPATIENT PHYSICAL THERAPY LOWER EXTREMITY PROGRESS NOTE   Patient Name: Misty Dodson MRN: [id number] DOB:Feb 20, 1936, 87 y.o., female Today's Date: 02/08/2023  END OF SESSION:  PT End of Session - 02/08/23 1312     Visit Number 2    Date for PT Re-Evaluation 04/03/23    Authorization Type BCBS Medicare    PT Start Time 0800    PT Stop Time 0845    PT Time Calculation (min) 45 min    Activity Tolerance Patient tolerated treatment well             Past Medical History:  Diagnosis Date   Anxiety    occasional "panic attacks"   Family history of anesthesia complication    son's "heart stopped" during knee repair age 68Yrs   GERD (gastroesophageal reflux disease)    Heart murmur    Hypertension    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   Past Surgical History:  Procedure Laterality Date   ABDOMINAL HYSTERECTOMY     ANTERIOR AND POSTERIOR REPAIR N/A 02/23/2014   Procedure: ANTERIOR (CYSTOCELE) AND POSTERIOR REPAIR (RECTOCELE);  Surgeon: Sherian Rein, MD;  Location: [facility];  Service: Gynecology;  Laterality: N/A;   BACK SURGERY     "several times"   CYSTOSCOPY N/A 02/23/2014   Procedure: CYSTOSCOPY;  Surgeon: Martina Sinner, MD;  Location: [facility];  Service: Urology;  Laterality: N/A;   FOOT SURGERY     PUBOVAGINAL SLING N/A 02/23/2014   Procedure: Leonides Grills;  Surgeon: Martina Sinner, MD;  Location: [facility];  Service: Urology;  Laterality: N/A;   TONSILLECTOMY     Patient Active Problem List   Diagnosis Date Noted   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 I pushed on my kneecap yesterday so it's tender today.  My knee still hurts.   FROM EVAL; Had right TKR  4/16; Using straight cane (uses minimally at home);  had a fall 1 1/2 weeks ago stepping off the curb.  Feels unstable.  I feel like I'm going backwards in getting better.  Had 22 sessions of PT at [facility] stopped mid June and not doing the HEP anymore (got bored with it);  still having constant pain medial and lateral knee and lower present even with sleeping;  no recent x-rays  Able to walk 1 mile with the dog (off leash) Here with her husband (previous pt at [facility]) Upcoming trips: [facility] in October Vacation to [facility] Sept 5th Gardener (townhouse) I'm afraid I couldn't get back up from the ground Dtr lives in [place]. [place] Goes by "[name]" PERTINENT HISTORY: HTN; GERD Right shoulder replacement PAIN:  Are you having pain? Yes NPRS scale: 6/10 Pain location: right medial knee and lateral knee to lower leg Aggravating factors: sit to stand; turning in bed; in/out of car Relieving factors: no pain with sitting  PRECAUTIONS: Fall     WEIGHT BEARING RESTRICTIONS: No  FALLS:  Has patient fallen in last 6 months? Yes. Number of falls 1  LIVING ENVIRONMENT: Lives with: lives with husband Lives in:  House/apartment Stairs: main living and bedroom downstairs; husband uses the space upstairs more than she does   OCCUPATION: retired  PLOF: Independent  PATIENT GOALS: I want this knee to quit hurting when I get up and down; I want it to feel stronger    OBJECTIVE:   DIAGNOSTIC FINDINGS: right knee OA  Add on eval-not in FOTO  COGNITION: Overall cognitive status: Within functional limits for tasks assessed      EDEMA:  Mild anterior edema and puffiness posterior knee Incision appears well healed Patellar hypomobility in all directions  LOWER EXTREMITY ROM: Seated active right knee ROM:  17-110  degrees Supine active right knee ROM 15-115 degrees  (left 0-150 degrees) Decreased right HS muscle length  LOWER EXTREMITY MMT: + extension lag with SLR and LAQ Unable to SLS on right without bil UE support (left side 10 sec without UE support) Able to rise sit to stand without UE support but significant weight shift towards the left  MMT Right eval Left eval  Hip flexion 5 5  Hip extension 4 5  Hip abduction 4 5  Hip adduction    Hip internal rotation    Hip external rotation    Knee flexion 4 5  Knee extension 4- 5  Ankle dorsiflexion    Ankle plantarflexion    Ankle inversion    Ankle eversion     (Blank rows = not tested)  FUNCTIONAL TESTS:  5x sit to stand: no hands but decreased WB on right 16.57 TUG with no hands, SPC 11.61 complains of pain upon rising 1st few steps  GAIT:  Comments: pt using SPC on surgical side;  discussed using in hand on non-affected side for optimal safety (history of knee instability) Up and down 4 steps reciprocally but needs bil railings for support   TODAY'S TREATMENT:   DATE: 7/19 Nu-Step 5 min L5 while discussing status Supine with towel roll under heel with heat on posterior thigh and 4# weight on front thigh 3 min to increase soft tissue extensibility and modulate pain Seated LAQ 4# 10x Manual therapy: supine grade 2/3 knee extension mobs 3x 20 sec; supine knee flexion with overpressure 10x; passive HS extension; seated knee flexion with overpressure 10x; seated assisted terminal knee extension 10x At the steps: foot on 2nd step 1 minute knee flexion, 1 min knee extension (husband recorded video for home practice) computer down so unable to start Medbridge Seated knee extension hang with heel on chair with 5# weight on thigh (husband recorded for home practice) LAQ 5# 10x (husband recorded for home practice) 6 inch step ups with Ues on railing 2x 10 WB on surgery leg with step over hurdle 10x                                                                                                                              DATE: 7/17  Discussed importance of continued HEP  PATIENT EDUCATION:  Education details:  Educated patient on anatomy and physiology of current symptoms, prognosis, plan of care as well as initial self care strategies to promote recovery Person educated: Patient Education method: Explanation Education comprehension: verbalized understanding  HOME EXERCISE PROGRAM: Discussed emphasis on achieving full knee extension ROM:  suggested every 2 hours help the knee get as straight as possible for 2-3 min using hands or a weight on thigh Will start a progressive HEP appropriate for her current level but has a HEP from previous facility Husband recorded videos of ex's on 7/19: LAQ 5#, seated hang with 5# on thigh, stairs stretching  ASSESSMENT:  CLINICAL IMPRESSION: The patient demonstrates improved knee extension ROM compared to initial evaluation.  Her primary complaint on eval was constant pain but with heat at start of session, reassurance and with gradual progression of ROM/exercise over the course of session, she tolerated all very well without complaints of pain.  Quick muscle fatigue of quads is evident especially with full weight bearing.  Therapist monitoring response to all interventions and modifying treatment accordingly.     OBJECTIVE IMPAIRMENTS: decreased activity tolerance, decreased balance, decreased mobility, difficulty walking, decreased ROM, decreased strength, increased edema, impaired perceived functional ability, impaired flexibility, and pain.   ACTIVITY LIMITATIONS: lifting, bending, standing, squatting, sleeping, stairs, transfers, bed mobility, and locomotion level  PARTICIPATION LIMITATIONS: shopping, community activity, and yard work  PERSONAL FACTORS: Good general health positively affect patient's functional outcome.   REHAB POTENTIAL: Good  CLINICAL DECISION MAKING:  Stable/uncomplicated  EVALUATION COMPLEXITY: Low   GOALS: Goals reviewed with patient? Yes  SHORT TERM GOALS: Target date:03/06/2023   The patient will demonstrate knowledge of basic self care strategies and exercises to promote healing  Baseline: Goal status: INITIAL  2.  Right knee extension to 10 degrees needed for a normalized, more efficient gait pattern Baseline:  Goal status: INITIAL  3.  Right knee flexion to 125 degrees for greater ease descending steps Baseline:  Goal status: INITIAL  4.  Improved quad strength to 4/5 needed for sit to stand transitions and in/out of the car with less pain Baseline:  Goal status: INITIAL  5.  5x sit to stand improved to 14.5 sec Baseline:  Goal status: INITIAL   LONG TERM GOALS: Target date: 04/03/2023  The patient will be independent in a safe self progression of a home exercise program to promote further recovery of function and relieve pain Baseline:  Goal status: INITIAL  2.  Right knee flexion 130 degrees needed for stooping for taking care of her dog and ease with descending curbs/steps Baseline:  Goal status: INITIAL  3.  Right knee extension to 6 degrees needed for balance/agility and gait safety for longer distance walking Baseline:  Goal status: INITIAL  4.  Right LE strength grossly 4+/5 needed for full weight acceptance when negotiating curbs, steps and with sit to stand transitions Baseline:  Goal status: INITIAL  5.  The patient will be able to get up off the floor/ground for gardening Baseline:  Goal status: INITIAL    PLAN:  PT FREQUENCY: 2x/week  PT DURATION: 8 weeks  PLANNED INTERVENTIONS: Therapeutic exercises, Therapeutic activity, Neuromuscular re-education, Balance training, Gait training, Patient/Family education, Self Care, Joint mobilization, Aquatic Therapy, Electrical stimulation, Cryotherapy, Moist heat, Taping, Vasopneumatic device, Manual therapy, and Re-evaluation  PLAN FOR NEXT  SESSION: Nu-Step for ROM for both knee flexion and extension; pt may purchase 5# weight for home; (computer down today so unable to start Medbridge) give specific ex's for HEP on knee  extension ROM and emphasize quad strengthening; progressive proprioceptive ex's for full WB on right; vasocompression as needed for pain/edema control  Lavinia Sharps, PT 02/08/23 1:13 PM Phone: [phone] Fax: [phone]

## 2023-02-12 ENCOUNTER — Ambulatory Visit: Payer: Medicare Other | Admitting: Physical Therapy

## 2023-02-12 DIAGNOSIS — R262 Difficulty in walking, not elsewhere classified: Secondary | ICD-10-CM

## 2023-02-12 DIAGNOSIS — M25561 Pain in right knee: Secondary | ICD-10-CM | POA: Diagnosis not present

## 2023-02-12 DIAGNOSIS — M25661 Stiffness of right knee, not elsewhere classified: Secondary | ICD-10-CM

## 2023-02-12 NOTE — Therapy (Signed)
OUTPATIENT PHYSICAL THERAPY LOWER EXTREMITY PROGRESS NOTE   Patient Name: Misty Dodson MRN: [id number] DOB:09/05/1935, 87 y.o., female Today's Date: 02/12/2023  END OF SESSION:  PT End of Session - 02/12/23 1116     Visit Number 3    Date for PT Re-Evaluation 04/03/23    Authorization Type BCBS Medicare    PT Start Time 1101    PT Stop Time 1145    PT Time Calculation (min) 44 min             Past Medical History:  Diagnosis Date   Anxiety    occasional "panic attacks"   Family history of anesthesia complication    son's "heart stopped" during knee repair age 54Yrs   GERD (gastroesophageal reflux disease)    Heart murmur    Hypertension    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   Past Surgical History:  Procedure Laterality Date   ABDOMINAL HYSTERECTOMY     ANTERIOR AND POSTERIOR REPAIR N/A 02/23/2014   Procedure: ANTERIOR (CYSTOCELE) AND POSTERIOR REPAIR (RECTOCELE);  Surgeon: Sherian Rein, MD;  Location: [facility];  Service: Gynecology;  Laterality: N/A;   BACK SURGERY     "several times"   CYSTOSCOPY N/A 02/23/2014   Procedure: CYSTOSCOPY;  Surgeon: Martina Sinner, MD;  Location: [facility];  Service: Urology;  Laterality: N/A;   FOOT SURGERY     PUBOVAGINAL SLING N/A 02/23/2014   Procedure: Leonides Grills;  Surgeon: Martina Sinner, MD;  Location: [facility];  Service: Urology;  Laterality: N/A;   TONSILLECTOMY     Patient Active Problem List   Diagnosis Date Noted   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    PCP: [name] MD  REFERRING PROVIDER: [name] MD  REFERRING DIAG: [id number] s/p TKR right  THERAPY DIAG:  Right knee pain; right knee stiffness; weakness; difficulty in walking Rationale for Evaluation and Treatment: Rehabilitation  ONSET DATE: October 2023 injury, TKR 11/06/22  SUBJECTIVE:   SUBJECTIVE STATEMENT: I don't feel like I can go without the cane yet.  States she got a 5# weight for home.     FROM EVAL; Had right TKR  4/16; Using straight cane (uses minimally at home);  had a fall 1 1/2 weeks ago stepping off the curb.  Feels unstable.  I feel like I'm going backwards in getting better.  Had 22 sessions of PT at [facility] stopped mid June and not doing the HEP anymore (got bored with it);  still having constant pain medial and lateral knee and lower present even with sleeping;  no recent x-rays  Able to walk 1 mile with the dog (off leash) Here with her husband (previous pt at [place]) Upcoming trips: [facility] in October Vacation to [facility] Sept 5th Gard[employer] (townhouse) I'm afraid I couldn't get back up from the ground Dtr lives in [place]. [place] Goes by "[name]" PERTINENT HISTORY: HTN; GERD Right shoulder replacement PAIN:  Are you having pain? Yes NPRS scale: 6/10 Pain location: right medial knee and lateral knee to lower leg Aggravating factors: sit to stand; turning in bed; in/out of car Relieving factors: no pain with sitting  PRECAUTIONS: Fall     WEIGHT BEARING RESTRICTIONS: No  FALLS:  Has patient fallen in last 6 months? Yes. Number of falls 1  LIVING ENVIRONMENT: Lives with: lives with husband Lives in: House/apartment Stairs: main living and bedroom downstairs; husband uses the space upstairs more than she does   OCCUPATION: retired  PLOF: Independent  PATIENT GOALS: I want this knee to quit hurting when I get up and down; I want it to feel stronger    OBJECTIVE:   DIAGNOSTIC FINDINGS: right knee OA  Add on eval-not in FOTO  COGNITION: Overall cognitive status: Within functional limits for tasks assessed      EDEMA:  Mild anterior edema and puffiness posterior knee Incision appears well healed Patellar hypomobility in all directions  LOWER EXTREMITY ROM: Seated active right knee ROM:  17-110 degrees Supine active right knee ROM 15-115 degrees  (left 0-150 degrees) Decreased right HS muscle length 7/23:  supine 127, active supine 15, passive  11 LOWER EXTREMITY MMT: + extension lag with SLR and LAQ Unable to SLS on right without bil UE support (left side 10 sec without UE support) Able to rise sit to stand without UE support but significant weight shift towards the left  MMT Right eval Left eval  Hip flexion 5 5  Hip extension 4 5  Hip abduction 4 5  Hip adduction    Hip internal rotation    Hip external rotation    Knee flexion 4 5  Knee extension 4- 5  Ankle dorsiflexion    Ankle plantarflexion    Ankle inversion    Ankle eversion     (Blank rows = not tested)  FUNCTIONAL TESTS:  5x sit to stand: no hands but decreased WB on right 16.57 TUG with no hands, SPC 11.61 complains of pain upon rising 1st few steps  GAIT:  Comments: pt using SPC on surgical side;  discussed using in hand on non-affected side for optimal safety (history of knee instability) Up and down 4 steps reciprocally but needs bil railings for support   TODAY'S TREATMENT:   DATE: 7/23 Nu-Step 5 min L5 Les most of the time Seated leg lifts for terminal knee extension 10x Seated LAQ 5# 10x Seated green band HS curls 10x  Manual therapy: supine grade 2/3 knee extension mobs 3x 20 sec; supine knee flexion with overpressure 10x; passive HS extension; seated knee flexion with overpressure 10x; seated assisted terminal knee extension 10x At the steps: foot on 2nd step 1 minute knee flexion, 1 min knee extension  Seated knee extension hang with heel on chair with 5# weight on thigh 3 minutes 6 inch step ups with Ues on railing x 10 2 inch step downs with single arm support 10x (easy) Leg press seat 7 60# 10x, 30# 12x  DATE: 7/19 Nu-Step 5 min L5 while discussing status Supine with towel roll under heel with heat on posterior thigh and 4# weight on front thigh 3 min to increase soft tissue extensibility and modulate pain Seated LAQ 4# 10x Manual therapy: supine grade 2/3 knee extension mobs 3x 20 sec; supine knee flexion with overpressure 10x;  passive HS extension; seated knee flexion with overpressure 10x; seated assisted terminal knee extension 10x At the steps: foot on 2nd step 1 minute knee flexion, 1 min knee extension (husband recorded video for home practice) computer down so unable to start Medbridge Seated knee extension hang with heel on chair with 5# weight on thigh (husband recorded for home practice) LAQ 5# 10x (husband recorded for home practice) 6 inch step ups with Ues on railing 2x 10 WB on surgery leg with step over hurdle 10x  DATE: 7/17  Discussed importance of continued HEP  PATIENT EDUCATION:  Education details: Educated patient on anatomy and physiology of current symptoms, prognosis, plan of care as well as initial self care strategies to promote recovery Person educated: Patient Education method: Explanation Education comprehension: verbalized understanding  HOME EXERCISE PROGRAM: Discussed emphasis on achieving full knee extension ROM:  suggested every 2 hours help the knee get as straight as possible for 2-3 min using hands or a weight on thigh Will start a progressive HEP appropriate for her current level but has a HEP from previous facility Husband recorded videos of ex's on 7/19: LAQ 5#, seated hang with 5# on thigh, stairs stretching  ASSESSMENT:  CLINICAL IMPRESSION: Improvement in knee flexion > extension ROM.  Stiffness and weakness with first few steps upon rising, therefore continues to use cane.  Verbal and tactile cues for patellofemoral alignment (tends to adduct and internally rotate).  Able to progress quad strengthening.  Some intermittent pain during session reported but not constant and improves with rest.    OBJECTIVE IMPAIRMENTS: decreased activity tolerance, decreased balance, decreased mobility, difficulty walking, decreased ROM, decreased strength, increased  edema, impaired perceived functional ability, impaired flexibility, and pain.   ACTIVITY LIMITATIONS: lifting, bending, standing, squatting, sleeping, stairs, transfers, bed mobility, and locomotion level  PARTICIPATION LIMITATIONS: shopping, community activity, and yard work  PERSONAL FACTORS: Good general health positively affect patient's functional outcome.   REHAB POTENTIAL: Good  CLINICAL DECISION MAKING: Stable/uncomplicated  EVALUATION COMPLEXITY: Low   GOALS: Goals reviewed with patient? Yes  SHORT TERM GOALS: Target date:03/06/2023   The patient will demonstrate knowledge of basic self care strategies and exercises to promote healing  Baseline: Goal status: INITIAL  2.  Right knee extension to 10 degrees needed for a normalized, more efficient gait pattern Baseline:  Goal status: INITIAL  3.  Right knee flexion to 125 degrees for greater ease descending steps Baseline:  Goal status: INITIAL  4.  Improved quad strength to 4/5 needed for sit to stand transitions and in/out of the car with less pain Baseline:  Goal status: INITIAL  5.  5x sit to stand improved to 14.5 sec Baseline:  Goal status: INITIAL   LONG TERM GOALS: Target date: 04/03/2023  The patient will be independent in a safe self progression of a home exercise program to promote further recovery of function and relieve pain Baseline:  Goal status: INITIAL  2.  Right knee flexion 130 degrees needed for stooping for taking care of her dog and ease with descending curbs/steps Baseline:  Goal status: INITIAL  3.  Right knee extension to 6 degrees needed for balance/agility and gait safety for longer distance walking Baseline:  Goal status: INITIAL  4.  Right LE strength grossly 4+/5 needed for full weight acceptance when negotiating curbs, steps and with sit to stand transitions Baseline:  Goal status: INITIAL  5.  The patient will be able to get up off the floor/ground for  gardening Baseline:  Goal status: INITIAL    PLAN:  PT FREQUENCY: 2x/week  PT DURATION: 8 weeks  PLANNED INTERVENTIONS: Therapeutic exercises, Therapeutic activity, Neuromuscular re-education, Balance training, Gait training, Patient/Family education, Self Care, Joint mobilization, Aquatic Therapy, Electrical stimulation, Cryotherapy, Moist heat, Taping, Vasopneumatic device, Manual therapy, and Re-evaluation  PLAN FOR NEXT SESSION: Nu-Step for ROM for both knee flexion and extension: HEP on knee extension ROM and emphasize quad strengthening; progressive proprioceptive ex's for full WB on right; vasocompression as needed for pain/edema control; begin light  pressure on knee to desensitize   Lavinia Sharps, PT 02/12/23 7:15 PM Phone: [phone] Fax: [phone]

## 2023-02-15 ENCOUNTER — Ambulatory Visit: Payer: Medicare Other | Admitting: Physical Therapy

## 2023-02-15 DIAGNOSIS — R262 Difficulty in walking, not elsewhere classified: Secondary | ICD-10-CM

## 2023-02-15 DIAGNOSIS — M25661 Stiffness of right knee, not elsewhere classified: Secondary | ICD-10-CM

## 2023-02-15 DIAGNOSIS — M25561 Pain in right knee: Secondary | ICD-10-CM

## 2023-02-15 NOTE — Therapy (Signed)
OUTPATIENT PHYSICAL THERAPY LOWER EXTREMITY PROGRESS NOTE   Patient Name: Misty Dodson MRN: [id number] DOB:[birth year], 87 y.o., female Today's Date: 02/15/2023  END OF SESSION:  PT End of Session - 02/15/23 1054     Visit Number 4    Date for PT Re-Evaluation 04/03/23    Authorization Type BCBS Medicare    PT Start Time 1015    PT Stop Time 1104    PT Time Calculation (min) 49 min    Activity Tolerance Patient tolerated treatment well             Past Medical History:  Diagnosis Date   Anxiety    occasional "panic attacks"   Family history of anesthesia complication    son's "heart stopped" during knee repair age 73Yrs   GERD (gastroesophageal reflux disease)    Heart murmur    Hypertension    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   Past Surgical History:  Procedure Laterality Date   ABDOMINAL HYSTERECTOMY     ANTERIOR AND POSTERIOR REPAIR N/A 02/23/2014   Procedure: ANTERIOR (CYSTOCELE) AND POSTERIOR REPAIR (RECTOCELE);  Surgeon: Misty Rein, MD;  Location: [facility];  Service: Gynecology;  Laterality: N/A;   BACK SURGERY     "several times"   CYSTOSCOPY N/A 02/23/2014   Procedure: CYSTOSCOPY;  Surgeon: Misty Sinner, MD;  Location: [facility];  Service: Urology;  Laterality: N/A;   FOOT SURGERY     PUBOVAGINAL SLING N/A 02/23/2014   Procedure: Misty Dodson;  Surgeon: Misty Sinner, MD;  Location: [facility];  Service: Urology;  Laterality: N/A;   TONSILLECTOMY     Patient Active Problem List   Diagnosis Date Noted   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    PCP: Misty Harries MD  REFERRING PROVIDER: [name] MD  REFERRING DIAG: [id number] s/p TKR right  THERAPY DIAG:  Right knee pain; right knee stiffness; weakness; difficulty in walking Rationale for Evaluation and Treatment: Rehabilitation  ONSET DATE: October 2023 injury, TKR 11/06/22  SUBJECTIVE:   SUBJECTIVE STATEMENT: I was shopping for bras and my  knee gave way yesterday.  I caught myself on the rack.  She states she has been using her 5# weight regularly at home for straightening her knee stretches and lifting her leg with it (LAQ).  FROM EVAL; Had right TKR  4/16; Using straight cane (uses minimally at home);  had a fall 1 1/2 weeks ago stepping off the curb.  Feels unstable.  I feel like I'm going backwards in getting better.  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 [place] Goes by "Misty Dodson" PERTINENT HISTORY: HTN; GERD Right shoulder replacement PAIN:  Are you having pain? Yes NPRS scale: 6/10 Pain location: right medial knee and lateral knee to lower leg Aggravating factors: sit to stand; turning in bed; in/out of car Relieving factors: no pain with sitting  PRECAUTIONS: Fall     WEIGHT BEARING RESTRICTIONS: No  FALLS:  Has patient fallen in last 6 months? Yes. Number of falls 1  LIVING ENVIRONMENT:  Lives with: lives with husband Lives in: House/apartment Stairs: main living and bedroom downstairs; husband uses the space upstairs more than she does   OCCUPATION: retired  PLOF: Independent  PATIENT GOALS: I want this knee to quit hurting when I get up and down; I want it to feel stronger    OBJECTIVE:   DIAGNOSTIC FINDINGS: right knee OA  Add on eval-not in FOTO  COGNITION: Overall cognitive status: Within functional limits for tasks assessed      EDEMA:  Mild anterior edema and puffiness posterior knee Incision appears well healed Patellar hypomobility in all directions  LOWER EXTREMITY  ROM: Seated active right knee ROM:  17-110 degrees Supine active right knee ROM 15-115 degrees  (left 0-150 degrees) Decreased right HS muscle length 7/23:  supine 127, active supine 15, passive 11 LOWER EXTREMITY MMT: + extension lag with SLR and LAQ Unable to SLS on right without bil UE support (left side 10 sec without UE support) Able to rise sit to stand without UE support but significant weight shift towards the left  MMT Right eval Left eval 7/26 right  Hip flexion 5 5   Hip extension 4 5 4+  Hip abduction 4 5 4   Hip adduction     Hip internal rotation     Hip external rotation     Knee flexion 4 5 4   Knee extension 4- 5 4-  Ankle dorsiflexion     Ankle plantarflexion     Ankle inversion     Ankle eversion      (Blank rows = not tested)  FUNCTIONAL TESTS:  5x sit to stand: no hands but decreased WB on right 16.57 TUG with no hands, SPC 11.61 complains of pain upon rising 1st few steps  GAIT:  Comments: pt using SPC on surgical side;  discussed using in hand on non-affected side for optimal safety (history of knee instability) Up and down 4 steps reciprocally but needs bil railings for support   TODAY'S TREATMENT:   DATE: 7/26 Nu-Step 5 min L6  Seated leg lifts for terminal knee extension 10x Manual therapy: supine grade 2/3 knee extension mobs 3x 20 sec; supine knee flexion with overpressure 10x; passive HS extension; seated knee flexion with overpressure 10x; seated assisted terminal knee extension 10x Sit to stand with surgery leg back further 10x no hands from hi low table (hips at 90 degrees) Resisted 10# backwards cable walk 8x CGA for safety with emphasis on knee extension 6 inch step ups with Ues on railing x 10 6 inch step downs with UE assist of railings 10x Leg press seat 7 70# 10x (challenging), attempted 35# but pt felt it was too hard decreased to 30# 13x  DATE: 7/23 Nu-Step 5 min L5 Les most of the time Seated leg lifts for terminal knee  extension 10x Seated LAQ 5# 10x Seated green band HS curls 10x  Manual therapy: supine grade 2/3 knee extension mobs 3x 20 sec; supine knee flexion with overpressure 10x; passive HS extension; seated knee flexion with overpressure 10x; seated assisted terminal knee extension 10x At the steps: foot on 2nd step 1 minute knee flexion, 1 min knee extension  Seated knee extension hang with heel on chair with 5# weight on thigh 3 minutes 6 inch step ups with Ues on railing x 10 2 inch step downs with single arm support 10x (easy) Leg press seat 7 60# 10x, 30# 10x  DATE: 7/19 Nu-Step 5 min L5 while discussing status Supine with  towel roll under heel with heat on posterior thigh and 4# weight on front thigh 3 min to increase soft tissue extensibility and modulate pain Seated LAQ 4# 10x Manual therapy: supine grade 2/3 knee extension mobs 3x 20 sec; supine knee flexion with overpressure 10x; passive HS extension; seated knee flexion with overpressure 10x; seated assisted terminal knee extension 10x At the steps: foot on 2nd step 1 minute knee flexion, 1 min knee extension (husband recorded video for home practice) computer down so unable to start Medbridge Seated knee extension hang with heel on chair with 5# weight on thigh (husband recorded for home practice) LAQ 5# 10x (husband recorded for home practice) 6 inch step ups with Ues on railing 2x 10 WB on surgery leg with step over hurdle 10x                                                                                                                             DATE: 7/17  Discussed importance of continued HEP  PATIENT EDUCATION:  Education details: Educated patient on anatomy and physiology of current symptoms, prognosis, plan of care as well as initial self care strategies to promote recovery Person educated: Patient Education method: Explanation Education comprehension: verbalized understanding  HOME EXERCISE PROGRAM: Access Code:  [email or web address] URL: [email or web address] Date: 02/15/2023 Prepared by: Lavinia Sharps  Exercises - Seated Passive Knee Extension  - 1 x daily - 7 x weekly - 1 sets - 10 reps - Seated Long Arc Quad with Ankle Weight  - 1 x daily - 7 x weekly - 2 sets - 10 reps - Sit to Stand  - 1 x daily - 7 x weekly - 1 sets - 10 reps Discussed emphasis on achieving full knee extension ROM:  suggested every 2 hours help the knee get as straight as possible for 2-3 min using hands or a weight on thigh Will start a progressive HEP appropriate for her current Dodson but has a HEP from previous facility Husband recorded videos of ex's on 7/19: LAQ 5#, seated hang with 5# on thigh, stairs stretching  ASSESSMENT:  CLINICAL IMPRESSION: Verbal and tactile cues for patellofemoral alignment and to decrease compensatory strategies including internal rotation and hip adduction during functional movements sit to stand and step downs.  Improving knee flexion, extension limited but improving since start of care.  Patient's primary complaint on eval day was knee pain.  Today she reports she is sore but the overall pain is better since starting PT.  We discussed that soreness is just a response of working the muscles needed to get stronger.   Progressing with STGs.     OBJECTIVE IMPAIRMENTS: decreased activity tolerance, decreased balance, decreased mobility, difficulty walking, decreased ROM, decreased strength, increased edema, impaired perceived functional ability, impaired flexibility, and pain.   ACTIVITY LIMITATIONS: lifting, bending, standing, squatting, sleeping, stairs, transfers, bed mobility, and locomotion Dodson  PARTICIPATION LIMITATIONS: shopping, community activity, and yard work  PERSONAL FACTORS: Peri [place]  general health positively affect patient's functional outcome.   REHAB POTENTIAL: Good  CLINICAL DECISION MAKING: Stable/uncomplicated  EVALUATION COMPLEXITY: Low   GOALS: Goals reviewed with  patient? Yes  SHORT TERM GOALS: Target date:03/06/2023   The patient will demonstrate knowledge of basic self care strategies and exercises to promote healing  Baseline: Goal status: met 7/26  2.  Right knee extension to 10 degrees needed for a normalized, more efficient gait pattern Baseline:  Goal status: INITIAL  3.  Right knee flexion to 125 degrees for greater ease descending steps Baseline:  Goal status: met 7/23  4.  Improved quad strength to 4/5 needed for sit to stand transitions and in/out of the car with less pain Baseline:  Goal status: INITIAL  5.  5x sit to stand improved to 14.5 sec Baseline:  Goal status: INITIAL   LONG TERM GOALS: Target date: 04/03/2023  The patient will be independent in a safe self progression of a home exercise program to promote further recovery of function and relieve pain Baseline:  Goal status: INITIAL  2.  Right knee flexion 130 degrees needed for stooping for taking care of her dog and ease with descending curbs/steps Baseline:  Goal status: INITIAL  3.  Right knee extension to 6 degrees needed for balance/agility and gait safety for longer distance walking Baseline:  Goal status: INITIAL  4.  Right LE strength grossly 4+/5 needed for full weight acceptance when negotiating curbs, steps and with sit to stand transitions Baseline:  Goal status: INITIAL  5.  The patient will be able to get up off the floor/ground for gardening Baseline:  Goal status: INITIAL    PLAN:  PT FREQUENCY: 2x/week  PT DURATION: 8 weeks  PLANNED INTERVENTIONS: Therapeutic exercises, Therapeutic activity, Neuromuscular re-education, Balance training, Gait training, Patient/Family education, Self Care, Joint mobilization, Aquatic Therapy, Electrical stimulation, Cryotherapy, Moist heat, Taping, Vasopneumatic device, Manual therapy, and Re-evaluation  PLAN FOR NEXT SESSION: check 5x STS for STG;  recheck knee ROM; Nu-Step for ROM for both knee  flexion and extension: HEP on knee extension ROM and emphasize quad strengthening; progressive proprioceptive ex's for full WB on right; vasocompression as needed for pain/edema control; begin light pressure on knee to desensitize;  continue cane secondary to knee instability  Lavinia Sharps, PT 02/15/23 11:14 AM Phone: [phone] Fax: [phone]

## 2023-02-18 ENCOUNTER — Encounter: Payer: Self-pay | Admitting: Rehabilitative and Restorative Service Providers"

## 2023-02-18 ENCOUNTER — Ambulatory Visit: Payer: Medicare Other | Admitting: Rehabilitative and Restorative Service Providers"

## 2023-02-18 DIAGNOSIS — M25561 Pain in right knee: Secondary | ICD-10-CM | POA: Diagnosis not present

## 2023-02-18 DIAGNOSIS — M25661 Stiffness of right knee, not elsewhere classified: Secondary | ICD-10-CM

## 2023-02-18 DIAGNOSIS — R262 Difficulty in walking, not elsewhere classified: Secondary | ICD-10-CM

## 2023-02-18 DIAGNOSIS — R600 Localized edema: Secondary | ICD-10-CM

## 2023-02-18 DIAGNOSIS — M6281 Muscle weakness (generalized): Secondary | ICD-10-CM

## 2023-02-18 NOTE — Therapy (Signed)
OUTPATIENT PHYSICAL THERAPY LOWER EXTREMITY PROGRESS NOTE   Patient Name: Misty Dodson MRN: [id number] DOB:1936/04/22, 87 y.o., female Today's Date: 02/18/2023  END OF SESSION:  PT End of Session - 02/18/23 0935     Visit Number 5    Date for PT Re-Evaluation 04/03/23    Authorization Type BCBS Medicare    PT Start Time 0930    PT Stop Time 1020    PT Time Calculation (min) 50 min    Activity Tolerance Patient limited by pain    Behavior During Therapy [facility] for tasks assessed/performed             Past Medical History:  Diagnosis Date   Anxiety    occasional "panic attacks"   Family history of anesthesia complication    son's "heart stopped" during knee repair age 79Yrs   GERD (gastroesophageal reflux disease)    Heart murmur    Hypertension    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   Past Surgical History:  Procedure Laterality Date   ABDOMINAL HYSTERECTOMY     ANTERIOR AND POSTERIOR REPAIR N/A 02/23/2014   Procedure: ANTERIOR (CYSTOCELE) AND POSTERIOR REPAIR (RECTOCELE);  Surgeon: Sherian Rein, MD;  Location: [facility];  Service: Gynecology;  Laterality: N/A;   BACK SURGERY     "several times"   CYSTOSCOPY N/A 02/23/2014   Procedure: CYSTOSCOPY;  Surgeon: Martina Sinner, MD;  Location: [facility];  Service: Urology;  Laterality: N/A;   FOOT SURGERY     PUBOVAGINAL SLING N/A 02/23/2014   Procedure: Leonides Grills;  Surgeon: Martina Sinner, MD;  Location: [facility];  Service: Urology;  Laterality: N/A;   TONSILLECTOMY     Patient Active Problem List   Diagnosis Date Noted   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    PCP: [name] MD  REFERRING PROVIDER: [name] MD  REFERRING DIAG: [phone] s/p TKR right  THERAPY DIAG:  Right knee pain; right knee stiffness; weakness; difficulty in walking Rationale for Evaluation and Treatment: Rehabilitation  ONSET DATE: October 2023 injury, TKR 11/06/22  SUBJECTIVE:    SUBJECTIVE STATEMENT: Pt reports no new losses of balance since that one last week.  States that she has been using the cane on either side, but reports unsteadiness with use on right hand.  Reports increased pain this morning.   Goes by "Misty Dodson" PERTINENT HISTORY: Right TKA on 11/06/2022 HTN; GERD, Right shoulder replacement ~3 years ago, hard of hearing (wears hearing aide)  PAIN:  Are you having pain? Yes NPRS scale: 7/10 Pain location: right medial knee and lateral knee to lower leg Aggravating factors: sit to stand; turning in bed; in/out of car Relieving factors: no pain with sitting  PRECAUTIONS: Fall     WEIGHT BEARING RESTRICTIONS: No  FALLS:  Has patient fallen in last 6 months? Yes. Number of falls 1  LIVING ENVIRONMENT: Lives with: lives with husband Lives in: House/apartment Stairs: main living and bedroom downstairs; husband uses the space upstairs more than she does   OCCUPATION: retired  PLOF: Independent  PATIENT GOALS: I want this knee to quit hurting when I get up and down; I want it to feel stronger Upcoming trips: [facility] in October Vacation to [facility] Sept 5th Gardener (townhouse) I'm afraid I couldn't get back up from the ground Dtr lives in [place]. [place]    OBJECTIVE:   DIAGNOSTIC FINDINGS: right knee OA  Add on eval-not in FOTO  COGNITION: Overall cognitive status: Within functional limits for  tasks assessed      EDEMA:  Mild anterior edema and puffiness posterior knee Incision appears well healed Patellar hypomobility in all directions  LOWER EXTREMITY ROM: Eval: Seated active right knee ROM:  17-110 degrees Supine active right knee ROM 15-115 degrees  (left 0-150 degrees) Decreased right HS muscle length  7/23:   supine 127, active supine 15, passive 11  02/18/2023: In sitting:  25-115 deg After stretching 15 to 120 deg  LOWER EXTREMITY MMT: + extension lag with SLR and LAQ Unable to SLS on right without bil UE support (left  side 10 sec without UE support) Able to rise sit to stand without UE support but significant weight shift towards the left  MMT Right eval Left eval 7/26 right  Hip flexion 5 5   Hip extension 4 5 4+  Hip abduction 4 5 4   Hip adduction     Hip internal rotation     Hip external rotation     Knee flexion 4 5 4   Knee extension 4- 5 4-  Ankle dorsiflexion     Ankle plantarflexion     Ankle inversion     Ankle eversion      (Blank rows = not tested)  FUNCTIONAL TESTS:  Eval: 5x sit to stand: no hands but decreased WB on right 16.57 TUG with no hands, SPC 11.61 complains of pain upon rising 1st few steps  02/18/2023: 5 times sit to stand:  12.60 sec without UE use (decreased weight bearing through RLE) Timed up and Go (TUG):  16.82 sec with SPC 3 minute walk test:  480 ft with SPC  GAIT:  Comments: pt using SPC on surgical side;  discussed using in hand on non-affected side for optimal safety (history of knee instability) Up and down 4 steps reciprocally but needs bil railings for support   TODAY'S TREATMENT:    DATE: 02/18/2023 Education on using SPC in left hand to help provide larger base of support and allow for more even weight bearing and improved balance Nustep level 5 x4 min with PT present to discuss status Recumbent bike level 1 with partial revolution back and forth to tolerance x3 min 5 times sit to stand and TUG Seated passive stretch to increase right knee extension Seated quad set with PT supporting RLE in long sitting position 2x10 (PT providing overpressure to knee) Seated LAQ with 1 sec hold/knee flexion 2x10 3 minute walk test:  480 ft with SPC Seated quad set x10 Game Ready to right knee:  3 snowflakes, medium compression.  X10 min   DATE: 7/26 Nu-Step 5 min L6  Seated leg lifts for terminal knee extension 10x Manual therapy: supine grade 2/3 knee extension mobs 3x 20 sec; supine knee flexion with overpressure 10x; passive HS extension; seated knee  flexion with overpressure 10x; seated assisted terminal knee extension 10x Sit to stand with surgery leg back further 10x no hands from hi low table (hips at 90 degrees) Resisted 10# backwards cable walk 8x CGA for safety with emphasis on knee extension 6 inch step ups with Ues on railing x 10 6 inch step downs with UE assist of railings 10x Leg press seat 7 70# 10x (challenging), attempted 35# but pt felt it was too hard decreased to 30# 13x  DATE: 7/23 Nu-Step 5 min L5 Les most of the time Seated leg lifts for terminal knee extension 10x Seated LAQ 5# 10x Seated green band HS curls 10x  Manual therapy: supine grade 2/3 knee  extension mobs 3x 20 sec; supine knee flexion with overpressure 10x; passive HS extension; seated knee flexion with overpressure 10x; seated assisted terminal knee extension 10x At the steps: foot on 2nd step 1 minute knee flexion, 1 min knee extension  Seated knee extension hang with heel on chair with 5# weight on thigh 3 minutes 6 inch step ups with Ues on railing x 10 2 inch step downs with single arm support 10x (easy) Leg press seat 7 60# 10x, 30# 10x   PATIENT EDUCATION:  Education details: Educated patient on anatomy and physiology of current symptoms, prognosis, plan of care as well as initial self care strategies to promote recovery Person educated: Patient Education method: Explanation Education comprehension: verbalized understanding  HOME EXERCISE PROGRAM: Access Code: [email or web address] URL: [email or web address] Date: 02/18/2023 Prepared by: Clydie Braun Tel Hevia  Exercises - Seated Passive Knee Extension  - 1 x daily - 7 x weekly - 1 sets - 10 reps - Seated Long Arc Quad with Ankle Weight  - 1 x daily - 7 x weekly - 2 sets - 10 reps - Sit to Stand  - 1 x daily - 7 x weekly - 1 sets - 10 reps - Seated Quad Set  - 1 x daily - 7 x weekly - 2 sets - 10 reps  Discussed emphasis on achieving full knee extension ROM:  suggested every 2 hours help the  knee get as straight as possible for 2-3 min using hands or a weight on thigh Will start a progressive HEP appropriate for her current level but has a HEP from previous facility Husband recorded videos of ex's on 7/19: LAQ 5#, seated hang with 5# on thigh, stairs stretching  ASSESSMENT:  CLINICAL IMPRESSION: Misty Dodson presents to skilled PT reporting that she is having increased pain today.  States that she had not had time to take her medication this AM, as this appointment was scheduled this morning.  Patient with increased stiffness/decreased A/ROM noted today on right knee.  Following passive manual therapy, patient able to progress with increased range of motion in sitting noted.  Patient verbalizes that she has been performing extension stretch at home with her leg long sitting on sofa.  Educated pt on the importance of placing her right heel/ankle on a pillow to allow for increased ability to perform extension stretch.  Pt verbalizes understanding.  Patient has improved time on 5 times sit to stand during session today.  Patient reported a significant improvement of pain following Game Ready at end of session.   OBJECTIVE IMPAIRMENTS: decreased activity tolerance, decreased balance, decreased mobility, difficulty walking, decreased ROM, decreased strength, increased edema, impaired perceived functional ability, impaired flexibility, and pain.   ACTIVITY LIMITATIONS: lifting, bending, standing, squatting, sleeping, stairs, transfers, bed mobility, and locomotion level  PARTICIPATION LIMITATIONS: shopping, community activity, and yard work  PERSONAL FACTORS: Good general health positively affect patient's functional outcome.   REHAB POTENTIAL: Good  CLINICAL DECISION MAKING: Stable/uncomplicated  EVALUATION COMPLEXITY: Low   GOALS: Goals reviewed with patient? Yes  SHORT TERM GOALS: Target date:03/06/2023   The patient will demonstrate knowledge of basic self care strategies and  exercises to promote healing  Baseline: Goal status: met 7/26  2.  Right knee extension to 10 degrees needed for a normalized, more efficient gait pattern Baseline:  Goal status: INITIAL  3.  Right knee flexion to 125 degrees for greater ease descending steps Baseline:  Goal status: met 7/23  4.  Improved quad strength to 4/5 needed for  sit to stand transitions and in/out of the car with less pain Baseline:  Goal status: IN PROGRESS  5.  5x sit to stand improved to 14.5 sec Baseline:  Goal status: MET on 02/18/2023   LONG TERM GOALS: Target date: 04/03/2023  The patient will be independent in a safe self progression of a home exercise program to promote further recovery of function and relieve pain Baseline:  Goal status: INITIAL  2.  Right knee flexion 130 degrees needed for stooping for taking care of her dog and ease with descending curbs/steps Baseline:  Goal status: INITIAL  3.  Right knee extension to 6 degrees needed for balance/agility and gait safety for longer distance walking Baseline:  Goal status: INITIAL  4.  Right LE strength grossly 4+/5 needed for full weight acceptance when negotiating curbs, steps and with sit to stand transitions Baseline:  Goal status: INITIAL  5.  The patient will be able to get up off the floor/ground for gardening Baseline:  Goal status: INITIAL    PLAN:  PT FREQUENCY: 2x/week  PT DURATION: 8 weeks  PLANNED INTERVENTIONS: Therapeutic exercises, Therapeutic activity, Neuromuscular re-education, Balance training, Gait training, Patient/Family education, Self Care, Joint mobilization, Aquatic Therapy, Electrical stimulation, Cryotherapy, Moist heat, Taping, Vasopneumatic device, Manual therapy, and Re-evaluation  PLAN FOR NEXT SESSION: Nu-Step for ROM for both knee flexion and extension: HEP on knee extension ROM and emphasize quad strengthening; progressive proprioceptive ex's for full WB on right; vasocompression as needed for  pain/edema control; begin light pressure on knee to desensitize;  continue cane secondary to knee instability    Reather Laurence, PT, DPT 02/18/23, 10:53 AM  [facility] [address] [place], [place] [address] Phone # [phone] Fax [phone]

## 2023-02-19 ENCOUNTER — Ambulatory Visit: Payer: Medicare Other | Admitting: Physical Therapy

## 2023-02-19 DIAGNOSIS — M25561 Pain in right knee: Secondary | ICD-10-CM | POA: Diagnosis not present

## 2023-02-19 DIAGNOSIS — M6281 Muscle weakness (generalized): Secondary | ICD-10-CM

## 2023-02-19 DIAGNOSIS — R262 Difficulty in walking, not elsewhere classified: Secondary | ICD-10-CM

## 2023-02-19 DIAGNOSIS — R600 Localized edema: Secondary | ICD-10-CM

## 2023-02-19 DIAGNOSIS — M25661 Stiffness of right knee, not elsewhere classified: Secondary | ICD-10-CM

## 2023-02-19 NOTE — Therapy (Signed)
OUTPATIENT PHYSICAL THERAPY LOWER EXTREMITY PROGRESS NOTE   Patient Name: Misty Dodson MRN: [id number] DOB:[birth year], 87 y.o., female Today's Date: 02/19/2023  END OF SESSION:  PT End of Session - 02/19/23 0926     Visit Number 6    Date for PT Re-Evaluation 04/03/23    Authorization Type BCBS Medicare    PT Start Time 0930    PT Stop Time 1014    PT Time Calculation (min) 44 min    Activity Tolerance Patient limited by pain             Past Medical History:  Diagnosis Date   Anxiety    occasional "panic attacks"   Family history of anesthesia complication    son's "heart stopped" during knee repair age 52Yrs   GERD (gastroesophageal reflux disease)    Heart murmur    Hypertension    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   Past Surgical History:  Procedure Laterality Date   ABDOMINAL HYSTERECTOMY     ANTERIOR AND POSTERIOR REPAIR N/A 02/23/2014   Procedure: ANTERIOR (CYSTOCELE) AND POSTERIOR REPAIR (RECTOCELE);  Surgeon: Sherian Rein, MD;  Location: [facility];  Service: Gynecology;  Laterality: N/A;   BACK SURGERY     "several times"   CYSTOSCOPY N/A 02/23/2014   Procedure: CYSTOSCOPY;  Surgeon: Martina Sinner, MD;  Location: [facility];  Service: Urology;  Laterality: N/A;   FOOT SURGERY     PUBOVAGINAL SLING N/A 02/23/2014   Procedure: Leonides Grills;  Surgeon: Martina Sinner, MD;  Location: [facility];  Service: Urology;  Laterality: N/A;   TONSILLECTOMY     Patient Active Problem List   Diagnosis Date Noted   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    PCP: [name] MD  REFERRING PROVIDER: [name] MD  REFERRING DIAG: [phone] s/p TKR right  THERAPY DIAG:  Right knee pain; right knee stiffness; weakness; difficulty in walking Rationale for Evaluation and Treatment: Rehabilitation  ONSET DATE: October 2023 injury, TKR 11/06/22  SUBJECTIVE:   SUBJECTIVE STATEMENT: My leg hurts this morning.  I tripped  over the dog and fell yesterday.   I didn't like the cold machine yesterday.  It's too cold.    Goes by "[name]" PERTINENT HISTORY: Right TKA on 11/06/2022 HTN; GERD, Right shoulder replacement ~3 years ago, hard of hearing (wears hearing aide)  PAIN:  Are you having pain? Yes NPRS scale: 0-5/10 only when I move a certain day, turning with leg planted  Pain location: right lateral knee to lower leg Aggravating factors: sit to stand; turning in bed; in/out of car Relieving factors: no pain with sitting  PRECAUTIONS: Fall     WEIGHT BEARING RESTRICTIONS: No  FALLS:  Has patient fallen in last 6 months? Yes. Number of falls 1  LIVING ENVIRONMENT: Lives with: lives with husband Lives in: House/apartment Stairs: main living and bedroom downstairs; husband uses the space upstairs more than she does   OCCUPATION: retired  PLOF: Independent  PATIENT GOALS: I want this knee to quit hurting when I get up and down; I want it to feel stronger Upcoming trips: [facility] in October Vacation to [facility] Sept 5th Gardener (townhouse) I'm afraid I couldn't get back up from the ground Dtr lives in [place]. [place]    OBJECTIVE:   DIAGNOSTIC FINDINGS: right knee OA  Add on eval-not in FOTO  COGNITION: Overall cognitive status: Within functional limits for tasks assessed      EDEMA:  Mild anterior  edema and puffiness posterior knee Incision appears well healed Patellar hypomobility in all directions  LOWER EXTREMITY ROM: Eval: Seated active right knee ROM:  17-110 degrees Supine active right knee ROM 15-115 degrees  (left 0-150 degrees) Decreased right HS muscle length  7/23:   supine 127, active supine 15, passive 11  02/18/2023: In sitting:  25-115 deg After stretching 15 to 120 deg  LOWER EXTREMITY MMT: + extension lag with SLR and LAQ Unable to SLS on right without bil UE support (left side 10 sec without UE support) Able to rise sit to stand without UE support but significant  weight shift towards the left  MMT Right eval Left eval 7/26 right  Hip flexion 5 5   Hip extension 4 5 4+  Hip abduction 4 5 4   Hip adduction     Hip internal rotation     Hip external rotation     Knee flexion 4 5 4   Knee extension 4- 5 4-  Ankle dorsiflexion     Ankle plantarflexion     Ankle inversion     Ankle eversion      (Blank rows = not tested)  FUNCTIONAL TESTS:  Eval: 5x sit to stand: no hands but decreased WB on right 16.57 TUG with no hands, SPC 11.61 complains of pain upon rising 1st few steps  02/18/2023: 5 times sit to stand:  12.60 sec without UE use (decreased weight bearing through RLE) Timed up and Go (TUG):  16.82 sec with SPC 3 minute walk test:  480 ft with SPC  GAIT:  Comments: pt using SPC on surgical side;  discussed using in hand on non-affected side for optimal safety (history of knee instability) Up and down 4 steps reciprocally but needs bil railings for support   TODAY'S TREATMENT:   DATE: 02/19/2023 Nustep level 5 x7 min with PT present to discuss status Manual therapy: with heat to HS in conjunction with supine grade 2/3 knee extension mobs 3x 20 sec; supine knee flexion with overpressure 10x; passive HS extension; seated knee flexion with overpressure 10x; seated assisted terminal knee extension 10x Seated LAQ 5# with 3 sec hold/knee flexion 2x10 Red band HS 10x (uncomfortable) Slant board/rockerboard calf stretch 5x 20 sec holds 6 inch step ups with Ues on railing x 10 HS stretch on step at the railing 2 min Aqua sport cord TKE 10x 3 sec hold Aqua sport cord Spanish squats 10x Leg press seat 7 75# 10x better control,  30# 13x   DATE: 02/18/2023 Education on using SPC in left hand to help provide larger base of support and allow for more even weight bearing and improved balance Nustep level 5 x4 min with PT present to discuss status Recumbent bike level 1 with partial revolution back and forth to tolerance x3 min 5 times sit to  stand and TUG Seated passive stretch to increase right knee extension Seated quad set with PT supporting RLE in long sitting position 2x10 (PT providing overpressure to knee) Seated LAQ with 1 sec hold/knee flexion 2x10 3 minute walk test:  480 ft with SPC Seated quad set x10 Game Ready to right knee:  3 snowflakes, medium compression.  X10 min   DATE: 7/26 Nu-Step 5 min L6  Seated leg lifts for terminal knee extension 10x Manual therapy: supine grade 2/3 knee extension mobs 3x 20 sec; supine knee flexion with overpressure 10x; passive HS extension; seated knee flexion with overpressure 10x; seated assisted terminal knee extension 10x Sit to  stand with surgery leg back further 10x no hands from hi low table (hips at 90 degrees) Resisted 10# backwards cable walk 8x CGA for safety with emphasis on knee extension 6 inch step ups with Ues on railing x 10 6 inch step downs with UE assist of railings 10x Leg press seat 7 70# 10x (challenging), attempted 35# but pt felt it was too hard decreased to 30# 13x  DATE: 7/23 Nu-Step 5 min L5 Les most of the time Seated leg lifts for terminal knee extension 10x Seated LAQ 5# 10x Seated green band HS curls 10x  Manual therapy: supine grade 2/3 knee extension mobs 3x 20 sec; supine knee flexion with overpressure 10x; passive HS extension; seated knee flexion with overpressure 10x; seated assisted terminal knee extension 10x At the steps: foot on 2nd step 1 minute knee flexion, 1 min knee extension  Seated knee extension hang with heel on chair with 5# weight on thigh 3 minutes 6 inch step ups with Ues on railing x 10 2 inch step downs with single arm support 10x (easy) Leg press seat 7 60# 10x, 30# 10x   PATIENT EDUCATION:  Education details: Educated patient on anatomy and physiology of current symptoms, prognosis, plan of care as well as initial self care strategies to promote recovery Person educated: Patient Education method:  Explanation Education comprehension: verbalized understanding  HOME EXERCISE PROGRAM: Access Code: [email or web address] URL: [email or web address] Date: 02/18/2023 Prepared by: Clydie Braun Menke  Exercises - Seated Passive Knee Extension  - 1 x daily - 7 x weekly - 1 sets - 10 reps - Seated Long Arc Quad with Ankle Weight  - 1 x daily - 7 x weekly - 2 sets - 10 reps - Sit to Stand  - 1 x daily - 7 x weekly - 1 sets - 10 reps - Seated Quad Set  - 1 x daily - 7 x weekly - 2 sets - 10 reps  Discussed emphasis on achieving full knee extension ROM:  suggested every 2 hours help the knee get as straight as possible for 2-3 min using hands or a weight on thigh Will start a progressive HEP appropriate for her current level but has a HEP from previous facility Husband recorded videos of ex's on 7/19: LAQ 5#, seated hang with 5# on thigh, stairs stretching  ASSESSMENT:  CLINICAL IMPRESSION: Treatment emphasis on quad strengthening and ROM for knee extension.  Verbal and tactile cues to activate quads rather than compensating with hip flexors.  End of session extension improved to lacking 13 degrees.   Much better with quad control with leg press return.   Some discomfort with resisted HS curls but overall pain is well controlled throughout session.  OBJECTIVE IMPAIRMENTS: decreased activity tolerance, decreased balance, decreased mobility, difficulty walking, decreased ROM, decreased strength, increased edema, impaired perceived functional ability, impaired flexibility, and pain.   ACTIVITY LIMITATIONS: lifting, bending, standing, squatting, sleeping, stairs, transfers, bed mobility, and locomotion level  PARTICIPATION LIMITATIONS: shopping, community activity, and yard work  PERSONAL FACTORS: Good general health positively affect patient's functional outcome.   REHAB POTENTIAL: Good  CLINICAL DECISION MAKING: Stable/uncomplicated  EVALUATION COMPLEXITY: Low   GOALS: Goals reviewed with  patient? Yes  SHORT TERM GOALS: Target date:03/06/2023   The patient will demonstrate knowledge of basic self care strategies and exercises to promote healing  Baseline: Goal status: met 7/26  2.  Right knee extension to 10 degrees needed for a normalized, more efficient gait pattern Baseline:  Goal status: INITIAL  3.  Right knee flexion to 125 degrees for greater ease descending steps Baseline:  Goal status: met 7/23  4.  Improved quad strength to 4/5 needed for sit to stand transitions and in/out of the car with less pain Baseline:  Goal status: IN PROGRESS  5.  5x sit to stand improved to 14.5 sec Baseline:  Goal status: MET on 02/18/2023   LONG TERM GOALS: Target date: 04/03/2023  The patient will be independent in a safe self progression of a home exercise program to promote further recovery of function and relieve pain Baseline:  Goal status: INITIAL  2.  Right knee flexion 130 degrees needed for stooping for taking care of her dog and ease with descending curbs/steps Baseline:  Goal status: INITIAL  3.  Right knee extension to 6 degrees needed for balance/agility and gait safety for longer distance walking Baseline:  Goal status: INITIAL  4.  Right LE strength grossly 4+/5 needed for full weight acceptance when negotiating curbs, steps and with sit to stand transitions Baseline:  Goal status: INITIAL  5.  The patient will be able to get up off the floor/ground for gardening Baseline:  Goal status: INITIAL    PLAN:  PT FREQUENCY: 2x/week  PT DURATION: 8 weeks  PLANNED INTERVENTIONS: Therapeutic exercises, Therapeutic activity, Neuromuscular re-education, Balance training, Gait training, Patient/Family education, Self Care, Joint mobilization, Aquatic Therapy, Electrical stimulation, Cryotherapy, Moist heat, Taping, Vasopneumatic device, Manual therapy, and Re-evaluation  PLAN FOR NEXT SESSION: Nu-Step for ROM for both knee flexion and extension: HEP on  knee extension ROM and emphasize quad strengthening; progressive proprioceptive ex's for full WB on right;   continue cane secondary to knee instability; doesn't like cold (hold on vasocompression)  Lavinia Sharps, PT 02/19/23 10:16 AM Phone: [phone] Fax: [phone]   [facility] [address] [place], [place] [address] Phone # [phone] Fax [phone]

## 2023-02-20 ENCOUNTER — Encounter: Payer: Self-pay | Admitting: Rehabilitative and Restorative Service Providers"

## 2023-02-26 ENCOUNTER — Encounter: Payer: Self-pay | Admitting: Physical Therapy

## 2023-02-26 ENCOUNTER — Ambulatory Visit: Payer: Medicare Other | Attending: Sports Medicine | Admitting: Physical Therapy

## 2023-02-26 DIAGNOSIS — R600 Localized edema: Secondary | ICD-10-CM | POA: Diagnosis present

## 2023-02-26 DIAGNOSIS — M25661 Stiffness of right knee, not elsewhere classified: Secondary | ICD-10-CM | POA: Diagnosis present

## 2023-02-26 DIAGNOSIS — M25561 Pain in right knee: Secondary | ICD-10-CM | POA: Diagnosis present

## 2023-02-26 DIAGNOSIS — M6281 Muscle weakness (generalized): Secondary | ICD-10-CM | POA: Insufficient documentation

## 2023-02-26 DIAGNOSIS — R262 Difficulty in walking, not elsewhere classified: Secondary | ICD-10-CM | POA: Insufficient documentation

## 2023-02-26 NOTE — Therapy (Signed)
OUTPATIENT PHYSICAL THERAPY LOWER EXTREMITY PROGRESS NOTE   Patient Name: Misty Dodson MRN: [id number] DOB:Jan 29, 1936, 87 y.o., female Today's Date: 02/26/2023  END OF SESSION:  PT End of Session - 02/26/23 1227     Visit Number 7    Date for PT Re-Evaluation 04/03/23    Authorization Type BCBS Medicare    PT Start Time 1227    PT Stop Time 1315    PT Time Calculation (min) 48 min    Activity Tolerance Patient limited by pain    Behavior During Therapy [facility] for tasks assessed/performed              Past Medical History:  Diagnosis Date   Anxiety    occasional "panic attacks"   Family history of anesthesia complication    son's "heart stopped" during knee repair age 10Yrs   GERD (gastroesophageal reflux disease)    Heart murmur    Hypertension    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   Past Surgical History:  Procedure Laterality Date   ABDOMINAL HYSTERECTOMY     ANTERIOR AND POSTERIOR REPAIR N/A 02/23/2014   Procedure: ANTERIOR (CYSTOCELE) AND POSTERIOR REPAIR (RECTOCELE);  Surgeon: Misty Rein, MD;  Location: [facility];  Service: Gynecology;  Laterality: N/A;   BACK SURGERY     "several times"   CYSTOSCOPY N/A 02/23/2014   Procedure: CYSTOSCOPY;  Surgeon: Misty Sinner, MD;  Location: [facility];  Service: Urology;  Laterality: N/A;   FOOT SURGERY     PUBOVAGINAL SLING N/A 02/23/2014   Procedure: Leonides Grills;  Surgeon: Misty Sinner, MD;  Location: [facility];  Service: Urology;  Laterality: N/A;   TONSILLECTOMY     Patient Active Problem List   Diagnosis Date Noted   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    PCP: Misty Harries MD  REFERRING PROVIDER: [name] MD  REFERRING DIAG: [phone] s/p TKR right  THERAPY DIAG:  Right knee pain; right knee stiffness; weakness; difficulty in walking Rationale for Evaluation and Treatment: Rehabilitation  ONSET DATE: October 2023 injury, TKR 11/06/22  SUBJECTIVE:    SUBJECTIVE STATEMENT: My sessions here have been better - I haven't made progress yet but I'm hopeful b/ we are doing more here.   Goes by "[name]" PERTINENT HISTORY: Right TKA on 11/06/2022 HTN; GERD, Right shoulder replacement ~3 years ago, hard of hearing (wears hearing aide)  PAIN:  Are you having pain? Yes NPRS scale: 5-7/10 only when I move a certain day, turning with leg planted  Pain location: right lateral knee to lower leg Aggravating factors: sit to stand; turning in bed; in/out of car Relieving factors: no pain with sitting  PRECAUTIONS: Fall     WEIGHT BEARING RESTRICTIONS: No  FALLS:  Has patient fallen in last 6 months? Yes. Number of falls 1  LIVING ENVIRONMENT: Lives with: lives with husband Lives in: House/apartment Stairs: main living and bedroom downstairs; husband uses the space upstairs more than she does   OCCUPATION: retired  PLOF: Independent  PATIENT GOALS: I want this knee to quit hurting when I get up and down; I want it to feel stronger Upcoming trips: [facility] in October Vacation to [facility] Sept 5th Gardener (townhouse) I'm afraid I couldn't get back up from the ground Dtr lives in [place]. [place]    OBJECTIVE:   DIAGNOSTIC FINDINGS: right knee OA  Add on eval-not in FOTO  COGNITION: Overall cognitive status: Within functional limits for tasks assessed  EDEMA:  Mild anterior edema and puffiness posterior knee Incision appears well healed Patellar hypomobility in all directions  LOWER EXTREMITY ROM: Eval: Seated active right knee ROM:  17-110 degrees Supine active right knee ROM 15-115 degrees  (left 0-150 degrees) Decreased right HS muscle length  7/23:   supine 127, active supine 15, passive 11  02/18/2023: In sitting:  25-115 deg After stretching 15 to 120 deg  LOWER EXTREMITY MMT: + extension lag with SLR and LAQ Unable to SLS on right without bil UE support (left side 10 sec without UE support) Able to rise sit to  stand without UE support but significant weight shift towards the left  MMT Right eval Left eval 7/26 right  Hip flexion 5 5   Hip extension 4 5 4+  Hip abduction 4 5 4   Hip adduction     Hip internal rotation     Hip external rotation     Knee flexion 4 5 4   Knee extension 4- 5 4-  Ankle dorsiflexion     Ankle plantarflexion     Ankle inversion     Ankle eversion      (Blank rows = not tested)  FUNCTIONAL TESTS:  Eval: 5x sit to stand: no hands but decreased WB on right 16.57 TUG with no hands, SPC 11.61 complains of pain upon rising 1st few steps  02/18/2023: 5 times sit to stand:  12.60 sec without UE use (decreased weight bearing through RLE) Timed up and Go (TUG):  16.82 sec with SPC 3 minute walk test:  480 ft with SPC  GAIT:  Comments: pt using SPC on surgical side;  discussed using in hand on non-affected side for optimal safety (history of knee instability) Up and down 4 steps reciprocally but needs bil railings for support   TODAY'S TREATMENT:   DATE: 02/26/2023 Nustep Dodson 5 x7 min with PT present to discuss status Seated with heat under Rt HS - foot propped on 4" step quad set with forward trunk bend for HS stretch 5x10" LAQ 5lb 2x10 3" holds Slant board/rockerboard calf stretch 5x 20 sec holds Aqua sport cord TKE 10x 3 sec hold 6 inch step ups with Ues on railing x 10 HS stretch on 2nd step then progressed to 3rd step at the railing 2 min Leg press seat 7 75# 10x good slow eccentric control,  30# 13x Manual therapy: supine with heat to HS and towel roll under heel Gr II/III knee extension mobs, patellar mobs all planes  DATE: 02/19/2023 Nustep Dodson 5 x7 min with PT present to discuss status Manual therapy: with heat to HS in conjunction with supine grade 2/3 knee extension mobs 3x 20 sec; supine knee flexion with overpressure 10x; passive HS extension; seated knee flexion with overpressure 10x; seated assisted terminal knee extension 10x Seated LAQ 5#  with 3 sec hold/knee flexion 2x10 Red band HS 10x (uncomfortable) Slant board/rockerboard calf stretch 5x 20 sec holds 6 inch step ups with Ues on railing x 10 HS stretch on step at the railing 2 min Aqua sport cord TKE 10x 3 sec hold Aqua sport cord Spanish squats 10x Leg press seat 7 75# 10x better control,  30# 13x   DATE: 02/18/2023 Education on using SPC in left hand to help provide larger base of support and allow for more even weight bearing and improved balance Nustep Dodson 5 x4 min with PT present to discuss status Recumbent bike Dodson 1 with partial revolution back and forth to  tolerance x3 min 5 times sit to stand and TUG Seated passive stretch to increase right knee extension Seated quad set with PT supporting RLE in long sitting position 2x10 (PT providing overpressure to knee) Seated LAQ with 1 sec hold/knee flexion 2x10 3 minute walk test:  480 ft with SPC Seated quad set x10 Game Ready to right knee:  3 snowflakes, medium compression.  X10 min    PATIENT EDUCATION:  Education details: Educated patient on anatomy and physiology of current symptoms, prognosis, plan of care as well as initial self care strategies to promote recovery Person educated: Patient Education method: Explanation Education comprehension: verbalized understanding  HOME EXERCISE PROGRAM: Access Code: [email or web address] URL: [email or web address] Date: 02/18/2023 Prepared by: Clydie Braun Menke  Exercises - Seated Passive Knee Extension  - 1 x daily - 7 x weekly - 1 sets - 10 reps - Seated Long Arc Quad with Ankle Weight  - 1 x daily - 7 x weekly - 2 sets - 10 reps - Sit to Stand  - 1 x daily - 7 x weekly - 1 sets - 10 reps - Seated Quad Set  - 1 x daily - 7 x weekly - 2 sets - 10 reps  Discussed emphasis on achieving full knee extension ROM:  suggested every 2 hours help the knee get as straight as possible for 2-3 min using hands or a weight on thigh Will start a progressive HEP appropriate  for her current Dodson but has a HEP from previous facility Husband recorded videos of ex's on 7/19: LAQ 5#, seated hang with 5# on thigh, stairs stretching  ASSESSMENT:  CLINICAL IMPRESSION: Pt continues to be limited in end range Rt knee extension but shows improving range especially within session with stretches, mobs and heat.  PT emphasized importance of stretching and extension propping daily between sessions to maximize progress.  She has improving quad contraction but needs cues and demo for TKE.    OBJECTIVE IMPAIRMENTS: decreased activity tolerance, decreased balance, decreased mobility, difficulty walking, decreased ROM, decreased strength, increased edema, impaired perceived functional ability, impaired flexibility, and pain.   ACTIVITY LIMITATIONS: lifting, bending, standing, squatting, sleeping, stairs, transfers, bed mobility, and locomotion Dodson  PARTICIPATION LIMITATIONS: shopping, community activity, and yard work  PERSONAL FACTORS: Good general health positively affect patient's functional outcome.   REHAB POTENTIAL: Good  CLINICAL DECISION MAKING: Stable/uncomplicated  EVALUATION COMPLEXITY: Low   GOALS: Goals reviewed with patient? Yes  SHORT TERM GOALS: Target date:03/06/2023   The patient will demonstrate knowledge of basic self care strategies and exercises to promote healing  Baseline: Goal status: met 7/26  2.  Right knee extension to 10 degrees needed for a normalized, more efficient gait pattern Baseline:  Goal status: INITIAL  3.  Right knee flexion to 125 degrees for greater ease descending steps Baseline:  Goal status: met 7/23  4.  Improved quad strength to 4/5 needed for sit to stand transitions and in/out of the car with less pain Baseline:  Goal status: IN PROGRESS  5.  5x sit to stand improved to 14.5 sec Baseline:  Goal status: MET on 02/18/2023   LONG TERM GOALS: Target date: 04/03/2023  The patient will be independent in a safe  self progression of a home exercise program to promote further recovery of function and relieve pain Baseline:  Goal status: INITIAL  2.  Right knee flexion 130 degrees needed for stooping for taking care of her dog and ease with descending curbs/steps Baseline:  Goal status: INITIAL  3.  Right knee extension to 6 degrees needed for balance/agility and gait safety for longer distance walking Baseline:  Goal status: INITIAL  4.  Right LE strength grossly 4+/5 needed for full weight acceptance when negotiating curbs, steps and with sit to stand transitions Baseline:  Goal status: INITIAL  5.  The patient will be able to get up off the floor/ground for gardening Baseline:  Goal status: INITIAL    PLAN:  PT FREQUENCY: 2x/week  PT DURATION: 8 weeks  PLANNED INTERVENTIONS: Therapeutic exercises, Therapeutic activity, Neuromuscular re-education, Balance training, Gait training, Patient/Family education, Self Care, Joint mobilization, Aquatic Therapy, Electrical stimulation, Cryotherapy, Moist heat, Taping, Vasopneumatic device, Manual therapy, and Re-evaluation  PLAN FOR NEXT SESSION: Nu-Step for ROM for both knee flexion and extension: HEP on knee extension ROM and emphasize quad strengthening; progressive proprioceptive ex's for full WB on right;   continue cane secondary to knee instability; doesn't like cold (hold on vasocompression)  Linus Weckerly, PT 02/26/23 1:28 PM  Phone: [phone] Fax: [phone]   [facility] [address], [address] [place], [place] [address] Phone # [phone] Fax [phone]

## 2023-02-28 ENCOUNTER — Ambulatory Visit: Payer: Medicare Other | Admitting: Physical Therapy

## 2023-02-28 ENCOUNTER — Encounter: Payer: Self-pay | Admitting: Physical Therapy

## 2023-02-28 DIAGNOSIS — R600 Localized edema: Secondary | ICD-10-CM

## 2023-02-28 DIAGNOSIS — M25661 Stiffness of right knee, not elsewhere classified: Secondary | ICD-10-CM

## 2023-02-28 DIAGNOSIS — R262 Difficulty in walking, not elsewhere classified: Secondary | ICD-10-CM

## 2023-02-28 DIAGNOSIS — M25561 Pain in right knee: Secondary | ICD-10-CM | POA: Diagnosis not present

## 2023-02-28 DIAGNOSIS — M6281 Muscle weakness (generalized): Secondary | ICD-10-CM

## 2023-02-28 NOTE — Therapy (Signed)
OUTPATIENT PHYSICAL THERAPY LOWER EXTREMITY PROGRESS NOTE   Patient Name: Misty Dodson MRN: [id number] DOB:01/02/1936, 87 y.o., female Today's Date: 02/28/2023  END OF SESSION:  PT End of Session - 02/28/23 1147     Visit Number 8    Date for PT Re-Evaluation 04/03/23    Authorization Type BCBS Medicare    PT Start Time 1145    PT Stop Time 1230    PT Time Calculation (min) 45 min    Activity Tolerance Patient limited by pain    Behavior During Therapy [facility] for tasks assessed/performed               Past Medical History:  Diagnosis Date   Anxiety    occasional "panic attacks"   Family history of anesthesia complication    son's "heart stopped" during knee repair age 77Yrs   GERD (gastroesophageal reflux disease)    Heart murmur    Hypertension    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   Past Surgical History:  Procedure Laterality Date   ABDOMINAL HYSTERECTOMY     ANTERIOR AND POSTERIOR REPAIR N/A 02/23/2014   Procedure: ANTERIOR (CYSTOCELE) AND POSTERIOR REPAIR (RECTOCELE);  Surgeon: Sherian Rein, MD;  Location: [facility];  Service: Gynecology;  Laterality: N/A;   BACK SURGERY     "several times"   CYSTOSCOPY N/A 02/23/2014   Procedure: CYSTOSCOPY;  Surgeon: Martina Sinner, MD;  Location: [facility];  Service: Urology;  Laterality: N/A;   FOOT SURGERY     PUBOVAGINAL SLING N/A 02/23/2014   Procedure: Leonides Grills;  Surgeon: Martina Sinner, MD;  Location: [facility];  Service: Urology;  Laterality: N/A;   TONSILLECTOMY     Patient Active Problem List   Diagnosis Date Noted   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  Goes by "[name]" PERTINENT HISTORY: Right TKA on 11/06/2022 HTN; GERD, Right shoulder replacement ~3 years ago, hard of hearing (wears hearing aide)  PAIN:  Are you having pain? Yes NPRS scale: 6/10 only when I move a certain day, turning with leg planted  Pain location: right lateral knee to lower leg Aggravating factors: sit to stand; turning in bed; in/out of car Relieving factors: no pain with sitting  PRECAUTIONS: Fall     WEIGHT BEARING RESTRICTIONS: No  FALLS:  Has patient fallen in last 6 months? Yes. Number of falls 1  LIVING ENVIRONMENT: Lives with: lives with husband Lives in: House/apartment Stairs: main living and bedroom downstairs; husband uses the space upstairs more than she does   OCCUPATION: retired  PLOF: Independent  PATIENT GOALS: I want this knee to quit hurting when I get up and down; I want it to feel stronger Upcoming trips: [facility] in October Vacation to [facility] Sept 5th Gardener (townhouse) I'm afraid I couldn't get back up from the ground Dtr lives in [place]. [place]    OBJECTIVE:   DIAGNOSTIC FINDINGS: right knee OA  Add on eval-not in FOTO  COGNITION: Overall cognitive status: Within functional limits for tasks assessed      EDEMA:  Mild  anterior edema and puffiness posterior knee Incision appears well healed Patellar hypomobility in all directions  LOWER EXTREMITY ROM: Eval: Seated active right knee ROM:  17-110 degrees Supine active right knee ROM 15-115 degrees  (left 0-150 degrees) Decreased right HS muscle length  7/23:   supine 127, active supine 15, passive 11  02/18/2023: In sitting:  25-115 deg After stretching 15 to 120 deg  LOWER EXTREMITY MMT: + extension lag with SLR and LAQ Unable to SLS on right without bil UE support (left side 10 sec without UE support) Able to rise sit to stand without UE support but  significant weight shift towards the left  MMT Right eval Left eval 7/26 right  Hip flexion 5 5   Hip extension 4 5 4+  Hip abduction 4 5 4   Hip adduction     Hip internal rotation     Hip external rotation     Knee flexion 4 5 4   Knee extension 4- 5 4-  Ankle dorsiflexion     Ankle plantarflexion     Ankle inversion     Ankle eversion      (Blank rows = not tested)  FUNCTIONAL TESTS:  Eval: 5x sit to stand: no hands but decreased WB on right 16.57 TUG with no hands, SPC 11.61 complains of pain upon rising 1st few steps  02/18/2023: 5 times sit to stand:  12.60 sec without UE use (decreased weight bearing through RLE) Timed up and Go (TUG):  16.82 sec with SPC 3 minute walk test:  480 ft with SPC  GAIT:  Comments: pt using SPC on surgical side;  discussed using in hand on non-affected side for optimal safety (history of knee instability) Up and down 4 steps reciprocally but needs bil railings for support   TODAY'S TREATMENT:   DATE: 02/28/2023 Nustep level 5 x7 min with PT present to discuss status Seated with heat under Rt HS - foot propped on 4" step quad set with forward trunk bend for HS stretch 5x10" LAQ 5lb 2x10 3" holds Slant board/rockerboard calf stretch 5x 20 sec holds Aqua sport cord TKE 10x 3 sec hold 6 inch step ups with Ues on railing x 10 Leg press seat 7 75# 10x good slow eccentric control,  30# 14x HS stretch on 2nd step then progressed to 3rd step at the railing 2 min Manual therapy: supine with heat to HS and towel roll under heel Gr II/III knee extension mobs, patellar mobs all planes  DATE: 02/26/2023 Nustep level 5 x7 min with PT present to discuss status Seated with heat under Rt HS - foot propped on 4" step quad set with forward trunk bend for HS stretch 5x10" LAQ 5lb 2x10 3" holds Slant board/rockerboard calf stretch 5x 20 sec holds Aqua sport cord TKE 10x 3 sec hold 6 inch step ups with Ues on railing x 10 HS stretch on 2nd step then  progressed to 3rd step at the railing 2 min Leg press seat 7 75# 10x good slow eccentric control,  30# 13x Manual therapy: supine with heat to HS and towel roll under heel Gr II/III knee extension mobs, patellar mobs all planes  DATE: 02/19/2023 Nustep level 5 x7 min with PT present to discuss status Manual therapy: with heat to HS in conjunction with supine grade 2/3 knee extension mobs 3x 20 sec; supine knee flexion with overpressure 10x; passive HS extension; seated knee flexion with overpressure 10x; seated assisted terminal knee extension 10x Seated LAQ 5#  with 3 sec hold/knee flexion 2x10 Red band HS 10x (uncomfortable) Slant board/rockerboard calf stretch 5x 20 sec holds 6 inch step ups with Ues on railing x 10 HS stretch on step at the railing 2 min Aqua sport cord TKE 10x 3 sec hold Aqua sport cord Spanish squats 10x Leg press seat 7 75# 10x better control,  30# 13x   PATIENT EDUCATION:  Education details: Educated patient on anatomy and physiology of current symptoms, prognosis, plan of care as well as initial self care strategies to promote recovery Person educated: Patient Education method: Explanation Education comprehension: verbalized understanding  HOME EXERCISE PROGRAM: Access Code: [email or web address] URL: [email or web address] Date: 02/18/2023 Prepared by: Clydie Braun Menke  Exercises - Seated Passive Knee Extension  - 1 x daily - 7 x weekly - 1 sets - 10 reps - Seated Long Arc Quad with Ankle Weight  - 1 x daily - 7 x weekly - 2 sets - 10 reps - Sit to Stand  - 1 x daily - 7 x weekly - 1 sets - 10 reps - Seated Quad Set  - 1 x daily - 7 x weekly - 2 sets - 10 reps  Discussed emphasis on achieving full knee extension ROM:  suggested every 2 hours help the knee get as straight as possible for 2-3 min using hands or a weight on thigh Will start a progressive HEP appropriate for her current level but has a HEP from previous facility Husband recorded videos of ex's  on 7/19: LAQ 5#, seated hang with 5# on thigh, stairs stretching  ASSESSMENT:  CLINICAL IMPRESSION: Pt reported pain relief after last session allowing a good night's sleep and painfree day the next day.  Pain had returned today to a 6/10 but Pt had reduced pain as she got into her exercises.  She continues to benefit from heat, mobilization and stretching within sessions for improved extension and flexion of Rt knee.  She demos much smoother control and confidence on leg press but has not yet been able to progress resistance level.  Good quad activation with quad sets, LAQ and leg press.  She lacks stability confidence with TKE and spanish squats this week and needs cueing and CGA for technique and safety.   OBJECTIVE IMPAIRMENTS: decreased activity tolerance, decreased balance, decreased mobility, difficulty walking, decreased ROM, decreased strength, increased edema, impaired perceived functional ability, impaired flexibility, and pain.   ACTIVITY LIMITATIONS: lifting, bending, standing, squatting, sleeping, stairs, transfers, bed mobility, and locomotion level  PARTICIPATION LIMITATIONS: shopping, community activity, and yard work  PERSONAL FACTORS: Good general health positively affect patient's functional outcome.   REHAB POTENTIAL: Good  CLINICAL DECISION MAKING: Stable/uncomplicated  EVALUATION COMPLEXITY: Low   GOALS: Goals reviewed with patient? Yes  SHORT TERM GOALS: Target date:03/06/2023   The patient will demonstrate knowledge of basic self care strategies and exercises to promote healing  Baseline: Goal status: met 7/26  2.  Right knee extension to 10 degrees needed for a normalized, more efficient gait pattern Baseline:  Goal status: ongoing  3.  Right knee flexion to 125 degrees for greater ease descending steps Baseline:  Goal status: met 7/23  4.  Improved quad strength to 4/5 needed for sit to stand transitions and in/out of the car with less pain Baseline:   Goal status: IN PROGRESS  5.  5x sit to stand improved to 14.5 sec Baseline:  Goal status: MET on 02/18/2023   LONG TERM GOALS: Target date: 04/03/2023  The patient will be independent  in a safe self progression of a home exercise program to promote further recovery of function and relieve pain Baseline:  Goal status: INITIAL  2.  Right knee flexion 130 degrees needed for stooping for taking care of her dog and ease with descending curbs/steps Baseline:  Goal status: INITIAL  3.  Right knee extension to 6 degrees needed for balance/agility and gait safety for longer distance walking Baseline:  Goal status: INITIAL  4.  Right LE strength grossly 4+/5 needed for full weight acceptance when negotiating curbs, steps and with sit to stand transitions Baseline:  Goal status: INITIAL  5.  The patient will be able to get up off the floor/ground for gardening Baseline:  Goal status: INITIAL    PLAN:  PT FREQUENCY: 2x/week  PT DURATION: 8 weeks  PLANNED INTERVENTIONS: Therapeutic exercises, Therapeutic activity, Neuromuscular re-education, Balance training, Gait training, Patient/Family education, Self Care, Joint mobilization, Aquatic Therapy, Electrical stimulation, Cryotherapy, Moist heat, Taping, Vasopneumatic device, Manual therapy, and Re-evaluation  PLAN FOR NEXT SESSION: Nu-Step for ROM for both knee flexion and extension: HEP on knee extension ROM and emphasize quad strengthening; progressive proprioceptive ex's for full WB on right;   continue cane secondary to knee instability; doesn't like cold (hold on vasocompression)  Vela Render, PT 02/28/23 1:02 PM   Phone: [phone] Fax: [phone]   [facility] [address], [address] [place], [place] [address] Phone # [phone] Fax [phone]

## 2023-03-06 ENCOUNTER — Ambulatory Visit: Payer: Medicare Other | Admitting: Physical Therapy

## 2023-03-06 DIAGNOSIS — M25561 Pain in right knee: Secondary | ICD-10-CM | POA: Diagnosis not present

## 2023-03-06 DIAGNOSIS — M6281 Muscle weakness (generalized): Secondary | ICD-10-CM

## 2023-03-06 DIAGNOSIS — R600 Localized edema: Secondary | ICD-10-CM

## 2023-03-06 DIAGNOSIS — R262 Difficulty in walking, not elsewhere classified: Secondary | ICD-10-CM

## 2023-03-06 DIAGNOSIS — M25661 Stiffness of right knee, not elsewhere classified: Secondary | ICD-10-CM

## 2023-03-06 NOTE — Therapy (Signed)
OUTPATIENT PHYSICAL THERAPY LOWER EXTREMITY PROGRESS NOTE   Patient Name: Misty Dodson MRN: [id number] DOB:1935/12/31, 87 y.o., female Today's Date: 03/06/2023  END OF SESSION:  PT End of Session - 03/06/23 1014     Visit Number 9    Date for PT Re-Evaluation 04/03/23    Authorization Type BCBS Medicare    PT Start Time 1015    PT Stop Time 1100    PT Time Calculation (min) 45 min    Activity Tolerance Patient tolerated treatment well               Past Medical History:  Diagnosis Date   Anxiety    occasional "panic attacks"   Family history of anesthesia complication    son's "heart stopped" during knee repair age 49Yrs   GERD (gastroesophageal reflux disease)    Heart murmur    Hypertension    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   Past Surgical History:  Procedure Laterality Date   ABDOMINAL HYSTERECTOMY     ANTERIOR AND POSTERIOR REPAIR N/A 02/23/2014   Procedure: ANTERIOR (CYSTOCELE) AND POSTERIOR REPAIR (RECTOCELE);  Surgeon: Sherian Rein, MD;  Location: [facility];  Service: Gynecology;  Laterality: N/A;   BACK SURGERY     "several times"   CYSTOSCOPY N/A 02/23/2014   Procedure: CYSTOSCOPY;  Surgeon: Martina Sinner, MD;  Location: [facility];  Service: Urology;  Laterality: N/A;   FOOT SURGERY     PUBOVAGINAL SLING N/A 02/23/2014   Procedure: Leonides Grills;  Surgeon: Martina Sinner, MD;  Location: [facility];  Service: Urology;  Laterality: N/A;   TONSILLECTOMY     Patient Active Problem List   Diagnosis Date Noted   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    PCP: [name] MD  REFERRING PROVIDER: [name] MD  REFERRING DIAG: [phone] s/p TKR right  THERAPY DIAG:  Right knee pain; right knee stiffness; weakness; difficulty in walking Rationale for Evaluation and Treatment: Rehabilitation  ONSET DATE: October 2023 injury, TKR 11/06/22  SUBJECTIVE:   SUBJECTIVE STATEMENT: I use the cane on my right,  it's hurting my left shoulder.  Sometimes I get this stabbing pain while walking and it makes me lose my balance.  Happens a couple times a week.  I had pain all night long, I had to get up and walk around.  I've fallen twice while you were gone.    Goes by "[name]" PERTINENT HISTORY: Right TKA on 11/06/2022 HTN; GERD, Right shoulder replacement ~3 years ago, hard of hearing (wears hearing aide)  PAIN:  Are you having pain? Yes NPRS scale: 2/10  Pain location: right lateral knee to lower leg Aggravating factors: sit to stand; turning in bed; in/out of car Relieving factors: no pain with sitting  PRECAUTIONS: Fall     WEIGHT BEARING RESTRICTIONS: No  FALLS:  Has patient fallen in last 6 months? Yes. Number of falls 1  LIVING ENVIRONMENT: Lives with: lives with husband Lives in: House/apartment Stairs: main living and bedroom downstairs; husband uses the space upstairs more than she does   OCCUPATION: retired  PLOF: Independent  PATIENT GOALS: I want this knee to quit hurting when I get up and down; I want it to feel stronger Upcoming trips: [facility] in October Vacation to [facility] Sept 5th Gardener (townhouse) I'm afraid I couldn't get back up from the ground Dtr lives in [place]. [place]    OBJECTIVE:   DIAGNOSTIC FINDINGS: right knee OA  Add on eval-not  in FOTO  COGNITION: Overall cognitive status: Within functional limits for tasks assessed      EDEMA:  Mild anterior edema and puffiness posterior knee Incision appears well healed Patellar hypomobility in all directions  LOWER EXTREMITY ROM: Eval: Seated active right knee ROM:  17-110 degrees Supine active right knee ROM 15-115 degrees  (left 0-150 degrees) Decreased right HS muscle length  7/23:   supine 127, active supine 15, passive 11  02/18/2023: In sitting:  25-115 deg After stretching 15 to 120 deg  8/14:  Supine  12-131  LOWER EXTREMITY MMT: + extension lag with SLR and LAQ Unable to SLS on right  without bil UE support (left side 10 sec without UE support) Able to rise sit to stand without UE support but significant weight shift towards the left  MMT Right eval Left eval 7/26 right  Hip flexion 5 5   Hip extension 4 5 4+  Hip abduction 4 5 4   Hip adduction     Hip internal rotation     Hip external rotation     Knee flexion 4 5 4   Knee extension 4- 5 4-  Ankle dorsiflexion     Ankle plantarflexion     Ankle inversion     Ankle eversion      (Blank rows = not tested)  FUNCTIONAL TESTS:  Eval: 5x sit to stand: no hands but decreased WB on right 16.57 TUG with no hands, SPC 11.61 complains of pain upon rising 1st few steps  02/18/2023: 5 times sit to stand:  12.60 sec without UE use (decreased weight bearing through RLE) Timed up and Go (TUG):  16.82 sec with SPC 3 minute walk test:  480 ft with SPC  GAIT:  Comments: pt using SPC on surgical side;  discussed using in hand on non-affected side for optimal safety (history of knee instability) Up and down 4 steps reciprocally but needs bil railings for support   TODAY'S TREATMENT:   DATE: 03/06/2023 Nustep level 5 x7  min with PT present to discuss status Manual therapy: supine with heat to HS and towel roll under heel Gr II/III knee extension mobs, patellar mobs all planes; seated knee flexion and extension with tibial rotation (pain with external rotation) LAQ 5lb 1x10 3" holds Sit to stand holding 5# weight 10x Aqua sport cord TKE 10x 3 sec hold Attempted 6 inch step ups but too painful, decreased step height to 4 inches with single Ue use  x 10 WB on right with 6 inch step taps 15x Leg press seat 7 75# 15x good slow eccentric control,  30# 15x Cane height seemed too low for patient, so elevated by 1 notch  DATE: 02/28/2023 Nustep level 5 x7 min with PT present to discuss status Seated with heat under Rt HS - foot propped on 4" step quad set with forward trunk bend for HS stretch 5x10" LAQ 5lb 2x10 3" holds Slant  board/rockerboard calf stretch 5x 20 sec holds Aqua sport cord TKE 10x 3 sec hold 6 inch step ups with Ues on railing x 10 Leg press seat 7 75# 10x good slow eccentric control,  30# 14x HS stretch on 2nd step then progressed to 3rd step at the railing 2 min Manual therapy: supine with heat to HS and towel roll under heel Gr II/III knee extension mobs, patellar mobs all planes  DATE: 02/26/2023 Nustep level 5 x7 min with PT present to discuss status Seated with heat under Rt HS - foot  propped on 4" step quad set with forward trunk bend for HS stretch 5x10" LAQ 5lb 2x10 3" holds Slant board/rockerboard calf stretch 5x 20 sec holds Aqua sport cord TKE 10x 3 sec hold 6 inch step ups with Ues on railing x 10 HS stretch on 2nd step then progressed to 3rd step at the railing 2 min Leg press seat 7 75# 10x good slow eccentric control,  30# 13x Manual therapy: supine with heat to HS and towel roll under heel Gr II/III knee extension mobs, patellar mobs all planes   PATIENT EDUCATION:  Education details: Educated patient on anatomy and physiology of current symptoms, prognosis, plan of care as well as initial self care strategies to promote recovery Person educated: Patient Education method: Explanation Education comprehension: verbalized understanding  HOME EXERCISE PROGRAM: Access Code: [email or web address] URL: [email or web address] Date: 02/18/2023 Prepared by: Clydie Braun Menke  Exercises - Seated Passive Knee Extension  - 1 x daily - 7 x weekly - 1 sets - 10 reps - Seated Long Arc Quad with Ankle Weight  - 1 x daily - 7 x weekly - 2 sets - 10 reps - Sit to Stand  - 1 x daily - 7 x weekly - 1 sets - 10 reps - Seated Quad Set  - 1 x daily - 7 x weekly - 2 sets - 10 reps  Discussed emphasis on achieving full knee extension ROM:  suggested every 2 hours help the knee get as straight as possible for 2-3 min using hands or a weight on thigh Will start a progressive HEP appropriate for her  current level but has a HEP from previous facility Husband recorded videos of ex's on 7/19: LAQ 5#, seated hang with 5# on thigh, stairs stretching  ASSESSMENT:  CLINICAL IMPRESSION: Good improvement in knee flexion and extension ROM, particularly flexion.  Variable pain but able to be produced/replicated with seated knee flexion with external rotation.  Many ex's are initially difficult (like the leg press) but become easier with repetition.  Verbal and tactile cues provided to activate quad muscles for optimal technique and benefit.    OBJECTIVE IMPAIRMENTS: decreased activity tolerance, decreased balance, decreased mobility, difficulty walking, decreased ROM, decreased strength, increased edema, impaired perceived functional ability, impaired flexibility, and pain.   ACTIVITY LIMITATIONS: lifting, bending, standing, squatting, sleeping, stairs, transfers, bed mobility, and locomotion level  PARTICIPATION LIMITATIONS: shopping, community activity, and yard work  PERSONAL FACTORS: Good general health positively affect patient's functional outcome.   REHAB POTENTIAL: Good  CLINICAL DECISION MAKING: Stable/uncomplicated  EVALUATION COMPLEXITY: Low   GOALS: Goals reviewed with patient? Yes  SHORT TERM GOALS: Target date:03/06/2023   The patient will demonstrate knowledge of basic self care strategies and exercises to promote healing  Baseline: Goal status: met 7/26  2.  Right knee extension to 10 degrees needed for a normalized, more efficient gait pattern Baseline:  Goal status: ongoing  3.  Right knee flexion to 125 degrees for greater ease descending steps Baseline:  Goal status: met 7/23  4.  Improved quad strength to 4/5 needed for sit to stand transitions and in/out of the car with less pain Baseline:  Goal status: IN PROGRESS  5.  5x sit to stand improved to 14.5 sec Baseline:  Goal status: MET on 02/18/2023   LONG TERM GOALS: Target date: 04/03/2023  The patient  will be independent in a safe self progression of a home exercise program to promote further recovery of function and relieve pain Baseline:  Goal status:  INITIAL  2.  Right knee flexion 130 degrees needed for stooping for taking care of her dog and ease with descending curbs/steps Baseline:  Goal status: INITIAL  3.  Right knee extension to 6 degrees needed for balance/agility and gait safety for longer distance walking Baseline:  Goal status: INITIAL  4.  Right LE strength grossly 4+/5 needed for full weight acceptance when negotiating curbs, steps and with sit to stand transitions Baseline:  Goal status: INITIAL  5.  The patient will be able to get up off the floor/ground for gardening Baseline:  Goal status: INITIAL    PLAN:  PT FREQUENCY: 2x/week  PT DURATION: 8 weeks  PLANNED INTERVENTIONS: Therapeutic exercises, Therapeutic activity, Neuromuscular re-education, Balance training, Gait training, Patient/Family education, Self Care, Joint mobilization, Aquatic Therapy, Electrical stimulation, Cryotherapy, Moist heat, Taping, Vasopneumatic device, Manual therapy, and Re-evaluation  PLAN FOR NEXT SESSION: 10th visit progress note;  Nu-Step for ROM for both knee flexion and extension: HEP on knee extension ROM and emphasize quad strengthening; progressive proprioceptive ex's for full WB on right;   continue cane secondary to knee instability; doesn't like cold (hold on vasocompression)   Lavinia Sharps, PT 03/06/23 4:40 PM Phone: [phone] Fax: [phone]   [facility] [address] [place], [place] [address] Phone # [phone] Fax [phone]

## 2023-03-08 ENCOUNTER — Ambulatory Visit: Payer: Medicare Other | Admitting: Physical Therapy

## 2023-03-08 DIAGNOSIS — M25561 Pain in right knee: Secondary | ICD-10-CM

## 2023-03-08 DIAGNOSIS — R262 Difficulty in walking, not elsewhere classified: Secondary | ICD-10-CM

## 2023-03-08 DIAGNOSIS — M25661 Stiffness of right knee, not elsewhere classified: Secondary | ICD-10-CM

## 2023-03-08 NOTE — Therapy (Signed)
OUTPATIENT PHYSICAL THERAPY LOWER EXTREMITY PROGRESS NOTE   Patient Name: Misty Dodson MRN: [id number] DOB:03-23-1936, 87 y.o., female Today's Date: 03/08/2023  Progress Note Reporting Period 02/06/23 to 03/08/23  See note below for Objective Data and Assessment of Progress/Goals.     END OF SESSION:  PT End of Session - 03/08/23 0934     Visit Number 10    Date for PT Re-Evaluation 04/03/23    Authorization Type BCBS Medicare    PT Start Time 0931    PT Stop Time 1014    PT Time Calculation (min) 43 min    Activity Tolerance Patient tolerated treatment well               Past Medical History:  Diagnosis Date   Anxiety    occasional "panic attacks"   Family history of anesthesia complication    son's "heart stopped" during knee repair age 39Yrs   GERD (gastroesophageal reflux disease)    Heart murmur    Hypertension    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   Past Surgical History:  Procedure Laterality Date   ABDOMINAL HYSTERECTOMY     ANTERIOR AND POSTERIOR REPAIR N/A 02/23/2014   Procedure: ANTERIOR (CYSTOCELE) AND POSTERIOR REPAIR (RECTOCELE);  Surgeon: Misty Rein, Dodson;  Location: [facility];  Service: Gynecology;  Laterality: N/A;   BACK SURGERY     "several times"   CYSTOSCOPY N/A 02/23/2014   Procedure: CYSTOSCOPY;  Surgeon: Misty Sinner, Dodson;  Location: [facility];  Service: Urology;  Laterality: N/A;   FOOT SURGERY     PUBOVAGINAL SLING N/A 02/23/2014   Procedure: Leonides Grills;  Surgeon: Misty Sinner, Dodson;  Location: [facility];  Service: Urology;  Laterality: N/A;   TONSILLECTOMY     Patient Active Problem List   Diagnosis Date Noted   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    PCP: Misty Dodson  REFERRING PROVIDER: [name] Dodson  REFERRING DIAG: [phone] s/p TKR right  THERAPY DIAG:  Right knee pain; right knee stiffness; weakness; difficulty in walking Rationale for Evaluation and Treatment:  Rehabilitation  ONSET DATE: October 2023 injury, TKR 11/06/22  SUBJECTIVE:   SUBJECTIVE STATEMENT: I try to not use this cane but I still need to.  Misty Dodson is worried about me going to [place].    Goes by "Misty Dodson" PERTINENT HISTORY: Right TKA on 11/06/2022 HTN; GERD, Right shoulder replacement ~3 years ago, hard of hearing (wears hearing aide)  PAIN:  Are you having pain? Yes NPRS scale: 2/10  Pain location: right lateral knee to lower leg Aggravating factors: sit to stand; turning in bed; in/out of car Relieving factors: no pain with sitting  PRECAUTIONS: Fall     WEIGHT BEARING RESTRICTIONS: No  FALLS:  Has patient fallen in last 6 months? Yes. Number of falls 1  LIVING ENVIRONMENT: Lives with: lives with husband Lives in: House/apartment Stairs: main living and bedroom downstairs; husband uses the space upstairs more than she does   OCCUPATION: retired  PLOF: Independent  PATIENT GOALS: I want this knee to quit hurting when I get up and down; I want it to feel stronger Upcoming trips: [facility] in October Vacation to [facility] Sept 5th Gard[place] (townhouse) I'm afraid I couldn't get back up from the ground Misty Dodson lives in [place]. [place]    OBJECTIVE:   DIAGNOSTIC FINDINGS: right knee OA  Add on eval-not in FOTO  COGNITION: Overall cognitive status: Within functional limits for tasks assessed  EDEMA:  Mild anterior edema and puffiness posterior knee Incision appears well healed Patellar hypomobility in all directions  LOWER EXTREMITY ROM: Eval: Seated active right knee ROM:  17-110 degrees Supine active right knee ROM 15-115 degrees  (left 0-150 degrees) Decreased right HS muscle length  7/23:   supine 127, active supine 15, passive 11  02/18/2023: In sitting:  25-115 deg After stretching 15 to 120 deg  8/14:  Supine  12-131 8/16: seated knee extension -13 degrees   LOWER EXTREMITY MMT: + extension lag with SLR and LAQ Unable to SLS on right without bil UE  support (left side 10 sec without UE support) Able to rise sit to stand without UE support but significant weight shift towards the left  MMT Right eval Left eval 7/26 right  Hip flexion 5 5   Hip extension 4 5 4+  Hip abduction 4 5 4   Hip adduction     Hip internal rotation     Hip external rotation     Knee flexion 4 5 4   Knee extension 4- 5 4-  Ankle dorsiflexion     Ankle plantarflexion     Ankle inversion     Ankle eversion      (Blank rows = not tested)  FUNCTIONAL TESTS:  Eval: 5x sit to stand: no hands but decreased WB on right 16.57 TUG with no hands, SPC 11.61 complains of pain upon rising 1st few steps  02/18/2023: 5 times sit to stand:  12.60 sec without UE use (decreased weight bearing through RLE) Timed up and Go (TUG):  16.82 sec with SPC 3 minute walk test:  480 ft with SPC  8/16: 5x to stand 11.1 2 min walk 386 feet with one instance of a knee jolt  GAIT:  Comments: pt using SPC on surgical side;  discussed using in hand on non-affected side for optimal safety (history of knee instability) Up and down 4 steps reciprocally but needs bil railings for support   TODAY'S TREATMENT:  DATE: 03/08/2023 Nustep level 5 x5  min with PT present to discuss status Sit to stand 5x test 2 min walk test Next to the counter: high step, stepping over objects, backwards walk, side stepping Rocker board 2 min 6 inch step up at the steps 10x 2 hand support 6 inch lateral step up (uncomfortable) 7x 6 inch step downs 2 hand support 2x10 WB on right with 16 inch step taps 15x Leg press seat 7 75# 15x good slow eccentric control,  30# 15x  DATE: 03/06/2023 Nustep level 5 x7  min with PT present to discuss status Manual therapy: supine with heat to HS and towel roll under heel Gr II/III knee extension mobs, patellar mobs all planes; seated knee flexion and extension with tibial rotation (pain with external rotation) LAQ 5lb 1x10 3" holds Sit to stand holding 5# weight  10x Aqua sport cord TKE 10x 3 sec hold Attempted 6 inch step ups but too painful, decreased step height to 4 inches with single Ue use  x 10 WB on right with 6 inch step taps 15x Leg press seat 7 75# 15x good slow eccentric control,  30# 15x Cane height seemed too low for patient, so elevated by 1 notch  DATE: 02/28/2023 Nustep level 5 x7 min with PT present to discuss status Seated with heat under Rt HS - foot propped on 4" step quad set with forward trunk bend for HS stretch 5x10" LAQ 5lb 2x10 3" holds Slant board/rockerboard  calf stretch 5x 20 sec holds Aqua sport cord TKE 10x 3 sec hold 6 inch step ups with Ues on railing x 10 Leg press seat 7 75# 10x good slow eccentric control,  30# 14x HS stretch on 2nd step then progressed to 3rd step at the railing 2 min Manual therapy: supine with heat to HS and towel roll under heel Gr II/III knee extension mobs, patellar mobs all planes  DATE: 02/26/2023 Nustep level 5 x7 min with PT present to discuss status Seated with heat under Rt HS - foot propped on 4" step quad set with forward trunk bend for HS stretch 5x10" LAQ 5lb 2x10 3" holds Slant board/rockerboard calf stretch 5x 20 sec holds Aqua sport cord TKE 10x 3 sec hold 6 inch step ups with Ues on railing x 10 HS stretch on 2nd step then progressed to 3rd step at the railing 2 min Leg press seat 7 75# 10x good slow eccentric control,  30# 13x Manual therapy: supine with heat to HS and towel roll under heel Gr II/III knee extension mobs, patellar mobs all planes   PATIENT EDUCATION:  Education details: Educated patient on anatomy and physiology of current symptoms, prognosis, plan of care as well as initial self care strategies to promote recovery Person educated: Patient Education method: Explanation Education comprehension: verbalized understanding  HOME EXERCISE PROGRAM: Access Code: [email or web address] URL: [email or web address] Date: 02/18/2023 Prepared by: Clydie Braun  Menke  Exercises - Seated Passive Knee Extension  - 1 x daily - 7 x weekly - 1 sets - 10 reps - Seated Long Arc Quad with Ankle Weight  - 1 x daily - 7 x weekly - 2 sets - 10 reps - Sit to Stand  - 1 x daily - 7 x weekly - 1 sets - 10 reps - Seated Quad Set  - 1 x daily - 7 x weekly - 2 sets - 10 reps  Discussed emphasis on achieving full knee extension ROM:  suggested every 2 hours help the knee get as straight as possible for 2-3 min using hands or a weight on thigh Will start a progressive HEP appropriate for her current level but has a HEP from previous facility Husband recorded videos of ex's on 7/19: LAQ 5#, seated hang with 5# on thigh, stairs stretching  ASSESSMENT:  CLINICAL IMPRESSION: Improving with quad muscle strength needed for rising, standing and for knee stability when walking. Knee flexion ROM is very good however she is limited with knee extension ROM.   She reports continued intermittent pain while walking or rising that can be sharp and therefore she feels she needs her cane.  She also has had some falls (none reported recently) related to knee giveway.  The patient would benefit from a continuation of skilled PT for a further progression of strengthening and functional mobility.  Will continue to update and promote independence in a HEP needed for a return to the highest functional level possible with ADLs.    OBJECTIVE IMPAIRMENTS: decreased activity tolerance, decreased balance, decreased mobility, difficulty walking, decreased ROM, decreased strength, increased edema, impaired perceived functional ability, impaired flexibility, and pain.   ACTIVITY LIMITATIONS: lifting, bending, standing, squatting, sleeping, stairs, transfers, bed mobility, and locomotion level  PARTICIPATION LIMITATIONS: shopping, community activity, and yard work  PERSONAL FACTORS: Good general health positively affect patient's functional outcome.   REHAB POTENTIAL: Good  CLINICAL DECISION  MAKING: Stable/uncomplicated  EVALUATION COMPLEXITY: Low   GOALS: Goals reviewed with patient? Yes  SHORT TERM GOALS: Target  date:03/06/2023   The patient will demonstrate knowledge of basic self care strategies and exercises to promote healing  Baseline: Goal status: met 7/26  2.  Right knee extension to 10 degrees needed for a normalized, more efficient gait pattern Baseline:  Goal status: ongoing  3.  Right knee flexion to 125 degrees for greater ease descending steps Baseline:  Goal status: met 7/23  4.  Improved quad strength to 4/5 needed for sit to stand transitions and in/out of the car with less pain Baseline:  Goal status: IN PROGRESS  5.  5x sit to stand improved to 14.5 sec Baseline:  Goal status: MET on 02/18/2023   LONG TERM GOALS: Target date: 04/03/2023  The patient will be independent in a safe self progression of a home exercise program to promote further recovery of function and relieve pain Baseline:  Goal status: INITIAL  2.  Right knee flexion 130 degrees needed for stooping for taking care of her dog and ease with descending curbs/steps Baseline:  Goal status: INITIAL  3.  Right knee extension to 6 degrees needed for balance/agility and gait safety for longer distance walking Baseline:  Goal status: INITIAL  4.  Right LE strength grossly 4+/5 needed for full weight acceptance when negotiating curbs, steps and with sit to stand transitions Baseline:  Goal status: INITIAL  5.  The patient will be able to get up off the floor/ground for gardening Baseline:  Goal status: INITIAL    PLAN:  PT FREQUENCY: 2x/week  PT DURATION: 8 weeks  PLANNED INTERVENTIONS: Therapeutic exercises, Therapeutic activity, Neuromuscular re-education, Balance training, Gait training, Patient/Family education, Self Care, Joint mobilization, Aquatic Therapy, Electrical stimulation, Cryotherapy, Moist heat, Taping, Vasopneumatic device, Manual therapy, and  Re-evaluation  PLAN FOR NEXT SESSION:  Nu-Step for ROM for knee extension;  emphasize quad strengthening; progressive proprioceptive ex's for full WB on right;   continue cane secondary to knee instability; doesn't like cold (hold on vasocompression)   Lavinia Sharps, PT 03/08/23 12:22 PM Phone: [phone] Fax: [phone]   [facility] [address] [place], [place] [address] Phone # [phone] Fax [phone]

## 2023-03-13 ENCOUNTER — Ambulatory Visit: Payer: Medicare Other | Admitting: Physical Therapy

## 2023-03-13 DIAGNOSIS — M25561 Pain in right knee: Secondary | ICD-10-CM

## 2023-03-13 DIAGNOSIS — M25661 Stiffness of right knee, not elsewhere classified: Secondary | ICD-10-CM

## 2023-03-13 DIAGNOSIS — R262 Difficulty in walking, not elsewhere classified: Secondary | ICD-10-CM

## 2023-03-13 DIAGNOSIS — R600 Localized edema: Secondary | ICD-10-CM

## 2023-03-13 NOTE — Therapy (Signed)
OUTPATIENT PHYSICAL THERAPY LOWER EXTREMITY PROGRESS NOTE   Patient Name: Misty Dodson MRN: [id number] DOB:[birth year], 87 y.o., female Today's Date: 03/13/2023  END OF SESSION:  PT End of Session - 03/13/23 1149     Visit Number 11    Date for PT Re-Evaluation 04/03/23    Authorization Type BCBS Medicare    PT Start Time 1147    PT Stop Time 1230    PT Time Calculation (min) 43 min    Activity Tolerance Patient tolerated treatment well               Past Medical History:  Diagnosis Date   Anxiety    occasional "panic attacks"   Family history of anesthesia complication    son's "heart stopped" during knee repair age 34Yrs   GERD (gastroesophageal reflux disease)    Heart murmur    Hypertension    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   Past Surgical History:  Procedure Laterality Date   ABDOMINAL HYSTERECTOMY     ANTERIOR AND POSTERIOR REPAIR N/A 02/23/2014   Procedure: ANTERIOR (CYSTOCELE) AND POSTERIOR REPAIR (RECTOCELE);  Surgeon: Sherian Rein, MD;  Location: [facility];  Service: Gynecology;  Laterality: N/A;   BACK SURGERY     "several times"   CYSTOSCOPY N/A 02/23/2014   Procedure: CYSTOSCOPY;  Surgeon: Martina Sinner, MD;  Location: [facility];  Service: Urology;  Laterality: N/A;   FOOT SURGERY     PUBOVAGINAL SLING N/A 02/23/2014   Procedure: Leonides Grills;  Surgeon: Martina Sinner, MD;  Location: [facility];  Service: Urology;  Laterality: N/A;   TONSILLECTOMY     Patient Active Problem List   Diagnosis Date Noted   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    PCP: [name] MD  REFERRING PROVIDER: [name] MD  REFERRING DIAG: [phone] s/p TKR right  THERAPY DIAG:  Right knee pain; right knee stiffness; weakness; difficulty in walking Rationale for Evaluation and Treatment: Rehabilitation  ONSET DATE: October 2023 injury, TKR 11/06/22  SUBJECTIVE:   SUBJECTIVE STATEMENT: It's still painful every day  and every night.  Last night every thing in my body hurt.    Goes by "[name]" PERTINENT HISTORY: Right TKA on 11/06/2022 HTN; GERD, Right shoulder replacement ~3 years ago, hard of hearing (wears hearing aide)  PAIN:  Are you having pain? Yes NPRS scale: 4/10 hurts pushing on Nu-Step Pain location: right lateral knee to lower leg Aggravating factors: sit to stand; turning in bed; in/out of car Relieving factors: no pain with sitting  PRECAUTIONS: Fall     WEIGHT BEARING RESTRICTIONS: No  FALLS:  Has patient fallen in last 6 months? Yes. Number of falls 1  LIVING ENVIRONMENT: Lives with: lives with husband Lives in: House/apartment Stairs: main living and bedroom downstairs; husband uses the space upstairs more than she does   OCCUPATION: retired  PLOF: Independent  PATIENT GOALS: I want this knee to quit hurting when I get up and down; I want it to feel stronger Upcoming trips: [facility] in October Vacation to [facility] Sept 5th Gardener (townhouse) I'm afraid I couldn't get back up from the ground Dtr lives in [place]. [place]    OBJECTIVE:   DIAGNOSTIC FINDINGS: right knee OA  Add on eval-not in FOTO  COGNITION: Overall cognitive status: Within functional limits for tasks assessed      EDEMA:  Mild anterior edema and puffiness posterior knee Incision appears well healed Patellar hypomobility in all directions  LOWER  EXTREMITY ROM: Eval: Seated active right knee ROM:  17-110 degrees Supine active right knee ROM 15-115 degrees  (left 0-150 degrees) Decreased right HS muscle length  7/23:   supine 127, active supine 15, passive 11  02/18/2023: In sitting:  25-115 deg After stretching 15 to 120 deg  8/14:  Supine  12-131 8/16: seated knee extension -13 degrees   LOWER EXTREMITY MMT: + extension lag with SLR and LAQ Unable to SLS on right without bil UE support (left side 10 sec without UE support) Able to rise sit to stand without UE support but significant  weight shift towards the left  MMT Right eval Left eval 7/26 right  Hip flexion 5 5   Hip extension 4 5 4+  Hip abduction 4 5 4   Hip adduction     Hip internal rotation     Hip external rotation     Knee flexion 4 5 4   Knee extension 4- 5 4-  Ankle dorsiflexion     Ankle plantarflexion     Ankle inversion     Ankle eversion      (Blank rows = not tested)  FUNCTIONAL TESTS:  Eval: 5x sit to stand: no hands but decreased WB on right 16.57 TUG with no hands, SPC 11.61 complains of pain upon rising 1st few steps  02/18/2023: 5 times sit to stand:  12.60 sec without UE use (decreased weight bearing through RLE) Timed up and Go (TUG):  16.82 sec with SPC 3 minute walk test:  480 ft with SPC  8/16: 5x to stand 11.1 2 min walk 386 feet with one instance of a knee jolt  GAIT:  Comments: pt using SPC on surgical side;  discussed using in hand on non-affected side for optimal safety (history of knee instability) Up and down 4 steps reciprocally but needs bil railings for support   TODAY'S TREATMENT:  DATE: 03/13/2023 Nustep level 5 x5  min with PT present to discuss status 2nd step knee flexion stretch 15x HS stretch on 2nd step 5x 20x 6 inch step up with 2 hand rails  WB on right 3 level step taps 10x Rocker board 2 min Step over 3 hurdles with min to no UE support 6x Sit to stand from 20 inch box with blue power cord around right knee with sit to stand 10x Dynamic balance with WB on surgery leg with other leg circle taps without UE support WB on right with hip hinge followed by 18 inch step taps 15x Leg press seat 7 80# 10x good slow eccentric control,  40# 11x  DATE: 03/08/2023 Nustep level 5 x5  min with PT present to discuss status Sit to stand 5x test 2 min walk test Next to the counter: high step, stepping over objects, backwards walk, side stepping Rocker board 2 min 6 inch step up at the steps 10x 2 hand support 6 inch lateral step up (uncomfortable) 7x 6 inch  step downs 2 hand support 2x10 WB on right with 16 inch step taps 15x Leg press seat 7 75# 15x good slow eccentric control,  30# 15x  DATE: 03/06/2023 Nustep level 5 x7  min with PT present to discuss status Manual therapy: supine with heat to HS and towel roll under heel Gr II/III knee extension mobs, patellar mobs all planes; seated knee flexion and extension with tibial rotation (pain with external rotation) LAQ 5lb 1x10 3" holds Sit to stand holding 5# weight 10x Aqua sport cord TKE 10x 3  sec hold Attempted 6 inch step ups but too painful, decreased step height to 4 inches with single Ue use  x 10 WB on right with 6 inch step taps 15x Leg press seat 7 75# 15x good slow eccentric control,  30# 15x Cane height seemed too low for patient, so elevated by 1 notch  DATE: 02/28/2023 Nustep level 5 x7 min with PT present to discuss status Seated with heat under Rt HS - foot propped on 4" step quad set with forward trunk bend for HS stretch 5x10" LAQ 5lb 2x10 3" holds Slant board/rockerboard calf stretch 5x 20 sec holds Aqua sport cord TKE 10x 3 sec hold 6 inch step ups with Ues on railing x 10 Leg press seat 7 75# 10x good slow eccentric control,  30# 14x HS stretch on 2nd step then progressed to 3rd step at the railing 2 min Manual therapy: supine with heat to HS and towel roll under heel Gr II/III knee extension mobs, patellar mobs all planes  DATE: 02/26/2023 Nustep level 5 x7 min with PT present to discuss status Seated with heat under Rt HS - foot propped on 4" step quad set with forward trunk bend for HS stretch 5x10" LAQ 5lb 2x10 3" holds Slant board/rockerboard calf stretch 5x 20 sec holds Aqua sport cord TKE 10x 3 sec hold 6 inch step ups with Ues on railing x 10 HS stretch on 2nd step then progressed to 3rd step at the railing 2 min Leg press seat 7 75# 10x good slow eccentric control,  30# 13x Manual therapy: supine with heat to HS and towel roll under heel Gr II/III knee  extension mobs, patellar mobs all planes   PATIENT EDUCATION:  Education details: Educated patient on anatomy and physiology of current symptoms, prognosis, plan of care as well as initial self care strategies to promote recovery Person educated: Patient Education method: Explanation Education comprehension: verbalized understanding  HOME EXERCISE PROGRAM: Access Code: [email or web address] URL: [email or web address] Date: 02/18/2023 Prepared by: Clydie Braun Menke  Exercises - Seated Passive Knee Extension  - 1 x daily - 7 x weekly - 1 sets - 10 reps - Seated Long Arc Quad with Ankle Weight  - 1 x daily - 7 x weekly - 2 sets - 10 reps - Sit to Stand  - 1 x daily - 7 x weekly - 1 sets - 10 reps - Seated Quad Set  - 1 x daily - 7 x weekly - 2 sets - 10 reps  Discussed emphasis on achieving full knee extension ROM:  suggested every 2 hours help the knee get as straight as possible for 2-3 min using hands or a weight on thigh Will start a progressive HEP appropriate for her current level but has a HEP from previous facility Husband recorded videos of ex's on 7/19: LAQ 5#, seated hang with 5# on thigh, stairs stretching  ASSESSMENT:  CLINICAL IMPRESSION:  Decreasing reliance on UE support as LE strength improves.  Treatment focus on weight bearing tolerance on right LE.  Able to increase resistance  on leg press with patient quite challenged at first but able to complete 10 reps with effort.  She denies pain at the end of session and expresses she's pleased of what she was able to do today.  Therapist monitoring response and progressing treatment accordingly.    OBJECTIVE IMPAIRMENTS: decreased activity tolerance, decreased balance, decreased mobility, difficulty walking, decreased ROM, decreased strength, increased edema, impaired perceived functional ability, impaired flexibility, and pain.  ACTIVITY LIMITATIONS: lifting, bending, standing, squatting, sleeping, stairs, transfers, bed  mobility, and locomotion level  PARTICIPATION LIMITATIONS: shopping, community activity, and yard work  PERSONAL FACTORS: Good general health positively affect patient's functional outcome.   REHAB POTENTIAL: Good  CLINICAL DECISION MAKING: Stable/uncomplicated  EVALUATION COMPLEXITY: Low   GOALS: Goals reviewed with patient? Yes  SHORT TERM GOALS: Target date:03/06/2023   The patient will demonstrate knowledge of basic self care strategies and exercises to promote healing  Baseline: Goal status: met 7/26  2.  Right knee extension to 10 degrees needed for a normalized, more efficient gait pattern Baseline:  Goal status: ongoing  3.  Right knee flexion to 125 degrees for greater ease descending steps Baseline:  Goal status: met 7/23  4.  Improved quad strength to 4/5 needed for sit to stand transitions and in/out of the car with less pain Baseline:  Goal status: IN PROGRESS  5.  5x sit to stand improved to 14.5 sec Baseline:  Goal status: MET on 02/18/2023   LONG TERM GOALS: Target date: 04/03/2023  The patient will be independent in a safe self progression of a home exercise program to promote further recovery of function and relieve pain Baseline:  Goal status: INITIAL  2.  Right knee flexion 130 degrees needed for stooping for taking care of her dog and ease with descending curbs/steps Baseline:  Goal status: INITIAL  3.  Right knee extension to 6 degrees needed for balance/agility and gait safety for longer distance walking Baseline:  Goal status: INITIAL  4.  Right LE strength grossly 4+/5 needed for full weight acceptance when negotiating curbs, steps and with sit to stand transitions Baseline:  Goal status: INITIAL  5.  The patient will be able to get up off the floor/ground for gardening Baseline:  Goal status: INITIAL    PLAN:  PT FREQUENCY: 2x/week  PT DURATION: 8 weeks  PLANNED INTERVENTIONS: Therapeutic exercises, Therapeutic activity,  Neuromuscular re-education, Balance training, Gait training, Patient/Family education, Self Care, Joint mobilization, Aquatic Therapy, Electrical stimulation, Cryotherapy, Moist heat, Taping, Vasopneumatic device, Manual therapy, and Re-evaluation  PLAN FOR NEXT SESSION:  check ROM; leg press;  Nu-Step for ROM for knee extension;  emphasize quad strengthening; progressive proprioceptive ex's for full WB on right;   continue cane secondary to knee instability; doesn't like cold (hold on vasocompression)    Lavinia Sharps, PT 03/13/23 4:53 PM Phone: [phone] Fax: [phone]  [facility] [address], [address] [place], [place] [address] Phone # [phone] Fax [phone]

## 2023-03-15 ENCOUNTER — Ambulatory Visit: Payer: Medicare Other | Admitting: Physical Therapy

## 2023-03-15 DIAGNOSIS — M25561 Pain in right knee: Secondary | ICD-10-CM | POA: Diagnosis not present

## 2023-03-15 DIAGNOSIS — R262 Difficulty in walking, not elsewhere classified: Secondary | ICD-10-CM

## 2023-03-15 DIAGNOSIS — M25661 Stiffness of right knee, not elsewhere classified: Secondary | ICD-10-CM

## 2023-03-15 NOTE — Therapy (Signed)
OUTPATIENT PHYSICAL THERAPY LOWER EXTREMITY PROGRESS NOTE   Patient Name: LACONDA HERSKOVITZ MRN: [id number] DOB:1936-07-15, 87 y.o., female Today's Date: 03/15/2023  END OF SESSION:  PT End of Session - 03/15/23 0931     Visit Number 12    Date for PT Re-Evaluation 04/03/23    Authorization Type BCBS Medicare    PT Start Time [phone]    PT Stop Time 1015    PT Time Calculation (min) 44 min    Activity Tolerance Patient tolerated treatment well               Past Medical History:  Diagnosis Date   Anxiety    occasional "panic attacks"   Family history of anesthesia complication    son's "heart stopped" during knee repair age 67Yrs   GERD (gastroesophageal reflux disease)    Heart murmur    Hypertension    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   Past Surgical History:  Procedure Laterality Date   ABDOMINAL HYSTERECTOMY     ANTERIOR AND POSTERIOR REPAIR N/A 02/23/2014   Procedure: ANTERIOR (CYSTOCELE) AND POSTERIOR REPAIR (RECTOCELE);  Surgeon: Sherian Rein, MD;  Location: [facility];  Service: Gynecology;  Laterality: N/A;   BACK SURGERY     "several times"   CYSTOSCOPY N/A 02/23/2014   Procedure: CYSTOSCOPY;  Surgeon: Martina Sinner, MD;  Location: [facility];  Service: Urology;  Laterality: N/A;   FOOT SURGERY     PUBOVAGINAL SLING N/A 02/23/2014   Procedure: Leonides Grills;  Surgeon: Martina Sinner, MD;  Location: [facility];  Service: Urology;  Laterality: N/A;   TONSILLECTOMY     Patient Active Problem List   Diagnosis Date Noted   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    PCP: [name] MD  REFERRING PROVIDER: [name] MD  REFERRING DIAG: [phone] s/p TKR right  THERAPY DIAG:  Right knee pain; right knee stiffness; weakness; difficulty in walking Rationale for Evaluation and Treatment: Rehabilitation  ONSET DATE: October 2023 injury, TKR 11/06/22  SUBJECTIVE:   SUBJECTIVE STATEMENT: I have not used my cane the  last 2 days.  I walked 1.4 and .9 miles without the cane!    Goes by "[name]" PERTINENT HISTORY: Right TKA on 11/06/2022 HTN; GERD, Right shoulder replacement ~3 years ago, hard of hearing (wears hearing aide)  PAIN:  Are you having pain? Yes NPRS scale: 3/10  Pain location: right lateral knee to lower leg Aggravating factors: sit to stand; turning in bed; in/out of car Relieving factors: no pain with sitting  PRECAUTIONS: Fall     WEIGHT BEARING RESTRICTIONS: No  FALLS:  Has patient fallen in last 6 months? Yes. Number of falls 1  LIVING ENVIRONMENT: Lives with: lives with husband Lives in: House/apartment Stairs: main living and bedroom downstairs; husband uses the space upstairs more than she does   OCCUPATION: retired  PLOF: Independent  PATIENT GOALS: I want this knee to quit hurting when I get up and down; I want it to feel stronger Upcoming trips: [facility] in October Vacation to [facility] Sept 5th Gardener (townhouse) I'm afraid I couldn't get back up from the ground Dtr lives in [place]. [place]    OBJECTIVE:   DIAGNOSTIC FINDINGS: right knee OA  Add on eval-not in FOTO  COGNITION: Overall cognitive status: Within functional limits for tasks assessed      EDEMA:  Mild anterior edema and puffiness posterior knee Incision appears well healed Patellar hypomobility in all directions  LOWER  EXTREMITY ROM: Eval: Seated active right knee ROM:  17-110 degrees Supine active right knee ROM 15-115 degrees  (left 0-150 degrees) Decreased right HS muscle length  7/23:   supine 127, active supine 15, passive 11  02/18/2023: In sitting:  25-115 deg After stretching 15 to 120 deg  8/14:  Supine  12-131 8/16: seated knee extension -13 degrees   LOWER EXTREMITY MMT: + extension lag with SLR and LAQ Unable to SLS on right without bil UE support (left side 10 sec without UE support) Able to rise sit to stand without UE support but significant weight shift towards the  left  MMT Right eval Left eval 7/26 right  Hip flexion 5 5   Hip extension 4 5 4+  Hip abduction 4 5 4   Hip adduction     Hip internal rotation     Hip external rotation     Knee flexion 4 5 4   Knee extension 4- 5 4-  Ankle dorsiflexion     Ankle plantarflexion     Ankle inversion     Ankle eversion      (Blank rows = not tested)  FUNCTIONAL TESTS:  Eval: 5x sit to stand: no hands but decreased WB on right 16.57 TUG with no hands, SPC 11.61 complains of pain upon rising 1st few steps  02/18/2023: 5 times sit to stand:  12.60 sec without UE use (decreased weight bearing through RLE) Timed up and Go (TUG):  16.82 sec with SPC 3 minute walk test:  480 ft with SPC  8/16: 5x to stand 11.1 2 min walk 386 feet with one instance of a knee jolt  GAIT:  Comments: pt using SPC on surgical side;  discussed using in hand on non-affected side for optimal safety (history of knee instability) Up and down 4 steps reciprocally but needs bil railings for support   TODAY'S TREATMENT:  DATE: 03/15/2023 Nustep level 5 x5  min with PT present to discuss status 2nd step knee flexion stretch 15x HS stretch on 2nd step 5x 20x 6 inch step up with 2 hand rails  WB on right with 12 inch step tap 15x  Ladder walking: high step, side step, in/out of boxes 2 laps each Sit to stand from chair with blue power cord around right knee with sit to stand 10x TKEs blue power cord 10x 5 sec hold Stand on foam in staggered stance with head turns, reaching and eyes closed Leg press seat 7 80# 15x good slow eccentric control,  40# 15x  DATE: 03/13/2023 Nustep level 5 x5  min with PT present to discuss status 2nd step knee flexion stretch 15x HS stretch on 2nd step 5x 20x 6 inch step up with 2 hand rails  WB on right 3 level step taps 10x Rocker board 2 min Step over 3 hurdles with min to no UE support 6x Sit to stand from 20 inch box with blue power cord around right knee with sit to stand 10x Dynamic  balance with WB on surgery leg with other leg circle taps without UE support WB on right with hip hinge followed by 18 inch step taps 15x Leg press seat 7 80# 10x good slow eccentric control,  40# 11x  DATE: 03/08/2023 Nustep level 5 x5  min with PT present to discuss status Sit to stand 5x test 2 min walk test Next to the counter: high step, stepping over objects, backwards walk, side stepping Rocker board 2 min 6 inch step up  at the steps 10x 2 hand support 6 inch lateral step up (uncomfortable) 7x 6 inch step downs 2 hand support 2x10 WB on right with 16 inch step taps 15x Leg press seat 7 75# 15x good slow eccentric control,  30# 15x  DATE: 03/06/2023 Nustep level 5 x7  min with PT present to discuss status Manual therapy: supine with heat to HS and towel roll under heel Gr II/III knee extension mobs, patellar mobs all planes; seated knee flexion and extension with tibial rotation (pain with external rotation) LAQ 5lb 1x10 3" holds Sit to stand holding 5# weight 10x Aqua sport cord TKE 10x 3 sec hold Attempted 6 inch step ups but too painful, decreased step height to 4 inches with single Ue use  x 10 WB on right with 6 inch step taps 15x Leg press seat 7 75# 15x good slow eccentric control,  30# 15x Cane height seemed too low for patient, so elevated by 1 notch  PATIENT EDUCATION:  Education details: Educated patient on anatomy and physiology of current symptoms, prognosis, plan of care as well as initial self care strategies to promote recovery Person educated: Patient Education method: Explanation Education comprehension: verbalized understanding  HOME EXERCISE PROGRAM: Access Code: [email or web address] URL: [email or web address] Date: 02/18/2023 Prepared by: Clydie Braun Menke  Exercises - Seated Passive Knee Extension  - 1 x daily - 7 x weekly - 1 sets - 10 reps - Seated Long Arc Quad with Ankle Weight  - 1 x daily - 7 x weekly - 2 sets - 10 reps - Sit to Stand  - 1 x  daily - 7 x weekly - 1 sets - 10 reps - Seated Quad Set  - 1 x daily - 7 x weekly - 2 sets - 10 reps  Discussed emphasis on achieving full knee extension ROM:  suggested every 2 hours help the knee get as straight as possible for 2-3 min using hands or a weight on thigh Will start a progressive HEP appropriate for her current level but has a HEP from previous facility Husband recorded videos of ex's on 7/19: LAQ 5#, seated hang with 5# on thigh, stairs stretching  ASSESSMENT:  CLINICAL IMPRESSION: The patient is able to walk within the clinic without use of her cane today and without any episodes of sharp pain or instability.   She is able to perform strengthening ex's focusing particularly on the quads with improved power noted with single leg press.   Agility ex's also performed to prepare for upcoming travel in [facility] and [facility].  CGA for safety but no physical assist needed from therapist for loss of balance.    OBJECTIVE IMPAIRMENTS: decreased activity tolerance, decreased balance, decreased mobility, difficulty walking, decreased ROM, decreased strength, increased edema, impaired perceived functional ability, impaired flexibility, and pain.   ACTIVITY LIMITATIONS: lifting, bending, standing, squatting, sleeping, stairs, transfers, bed mobility, and locomotion level  PARTICIPATION LIMITATIONS: shopping, community activity, and yard work  PERSONAL FACTORS: Good general health positively affect patient's functional outcome.   REHAB POTENTIAL: Good  CLINICAL DECISION MAKING: Stable/uncomplicated  EVALUATION COMPLEXITY: Low   GOALS: Goals reviewed with patient? Yes  SHORT TERM GOALS: Target date:03/06/2023   The patient will demonstrate knowledge of basic self care strategies and exercises to promote healing  Baseline: Goal status: met 7/26  2.  Right knee extension to 10 degrees needed for a normalized, more efficient gait pattern Baseline:  Goal status: ongoing  3.   Right knee flexion to 125  degrees for greater ease descending steps Baseline:  Goal status: met 7/23  4.  Improved quad strength to 4/5 needed for sit to stand transitions and in/out of the car with less pain Baseline:  Goal status: IN PROGRESS  5.  5x sit to stand improved to 14.5 sec Baseline:  Goal status: MET on 02/18/2023   LONG TERM GOALS: Target date: 04/03/2023  The patient will be independent in a safe self progression of a home exercise program to promote further recovery of function and relieve pain Baseline:  Goal status: INITIAL  2.  Right knee flexion 130 degrees needed for stooping for taking care of her dog and ease with descending curbs/steps Baseline:  Goal status: INITIAL  3.  Right knee extension to 6 degrees needed for balance/agility and gait safety for longer distance walking Baseline:  Goal status: INITIAL  4.  Right LE strength grossly 4+/5 needed for full weight acceptance when negotiating curbs, steps and with sit to stand transitions Baseline:  Goal status: INITIAL  5.  The patient will be able to get up off the floor/ground for gardening Baseline:  Goal status: INITIAL    PLAN:  PT FREQUENCY: 2x/week  PT DURATION: 8 weeks  PLANNED INTERVENTIONS: Therapeutic exercises, Therapeutic activity, Neuromuscular re-education, Balance training, Gait training, Patient/Family education, Self Care, Joint mobilization, Aquatic Therapy, Electrical stimulation, Cryotherapy, Moist heat, Taping, Vasopneumatic device, Manual therapy, and Re-evaluation  PLAN FOR NEXT SESSION:   leg press;  Nu-Step for ROM for knee extension;  emphasize quad strengthening; progressive proprioceptive ex's for full WB on right;  doesn't like cold (hold on vasocompression)    Lavinia Sharps, PT 03/15/23 11:36 AM Phone: [phone] Fax: [phone]   [facility] [address], [address] [place], [place] [address] Phone # [phone] Fax  [phone]

## 2023-03-19 ENCOUNTER — Ambulatory Visit: Payer: Medicare Other | Admitting: Physical Therapy

## 2023-03-19 DIAGNOSIS — M25661 Stiffness of right knee, not elsewhere classified: Secondary | ICD-10-CM

## 2023-03-19 DIAGNOSIS — R262 Difficulty in walking, not elsewhere classified: Secondary | ICD-10-CM

## 2023-03-19 DIAGNOSIS — M25561 Pain in right knee: Secondary | ICD-10-CM | POA: Diagnosis not present

## 2023-03-19 NOTE — Therapy (Signed)
OUTPATIENT PHYSICAL THERAPY LOWER EXTREMITY PROGRESS NOTE   Patient Name: Misty Dodson MRN: [id number] DOB:September 22, 1935, 87 y.o., female Today's Date: 03/19/2023  END OF SESSION:  PT End of Session - 03/19/23 0927     Visit Number 13    Date for PT Re-Evaluation 04/03/23    Authorization Type BCBS Medicare    PT Start Time 0930    PT Stop Time 1015    PT Time Calculation (min) 45 min    Activity Tolerance Patient tolerated treatment well               Past Medical History:  Diagnosis Date   Anxiety    occasional "panic attacks"   Family history of anesthesia complication    son's "heart stopped" during knee repair age 26Yrs   GERD (gastroesophageal reflux disease)    Heart murmur    Hypertension    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   Past Surgical History:  Procedure Laterality Date   ABDOMINAL HYSTERECTOMY     ANTERIOR AND POSTERIOR REPAIR N/A 02/23/2014   Procedure: ANTERIOR (CYSTOCELE) AND POSTERIOR REPAIR (RECTOCELE);  Surgeon: Sherian Rein, MD;  Location: [facility];  Service: Gynecology;  Laterality: N/A;   BACK SURGERY     "several times"   CYSTOSCOPY N/A 02/23/2014   Procedure: CYSTOSCOPY;  Surgeon: Martina Sinner, MD;  Location: [facility];  Service: Urology;  Laterality: N/A;   FOOT SURGERY     PUBOVAGINAL SLING N/A 02/23/2014   Procedure: Leonides Grills;  Surgeon: Martina Sinner, MD;  Location: [facility];  Service: Urology;  Laterality: N/A;   TONSILLECTOMY     Patient Active Problem List   Diagnosis Date Noted   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    PCP: [name] MD  REFERRING PROVIDER: [name] MD  REFERRING DIAG: [phone] s/p TKR right  THERAPY DIAG:  Right knee pain; right knee stiffness; weakness; difficulty in walking Rationale for Evaluation and Treatment: Rehabilitation  ONSET DATE: October 2023 injury, TKR 11/06/22  SUBJECTIVE:   SUBJECTIVE STATEMENT: Walked 1.3 without the cane  yesterday.  I'm feeling much better.   Goes by "[name]" PERTINENT HISTORY: Right TKA on 11/06/2022 HTN; GERD, Right shoulder replacement ~3 years ago, hard of hearing (wears hearing aide)  PAIN:  Are you having pain? Yes NPRS scale: 2/10  Pain location: right lateral knee to lower leg Aggravating factors: sit to stand; turning in bed; in/out of car Relieving factors: no pain with sitting  PRECAUTIONS: Fall     WEIGHT BEARING RESTRICTIONS: No  FALLS:  Has patient fallen in last 6 months? Yes. Number of falls 1  LIVING ENVIRONMENT: Lives with: lives with husband Lives in: House/apartment Stairs: main living and bedroom downstairs; husband uses the space upstairs more than she does   OCCUPATION: retired  PLOF: Independent  PATIENT GOALS: I want this knee to quit hurting when I get up and down; I want it to feel stronger Upcoming trips: [facility] in October Vacation to [facility] Sept 5th Gardener (townhouse) I'm afraid I couldn't get back up from the ground Dtr lives in [place]. [place]    OBJECTIVE:   DIAGNOSTIC FINDINGS: right knee OA  Add on eval-not in FOTO  COGNITION: Overall cognitive status: Within functional limits for tasks assessed      EDEMA:  Mild anterior edema and puffiness posterior knee Incision appears well healed Patellar hypomobility in all directions  LOWER EXTREMITY ROM: Eval: Seated active right knee ROM:  17-110  degrees Supine active right knee ROM 15-115 degrees  (left 0-150 degrees) Decreased right HS muscle length  7/23:   supine 127, active supine 15, passive 11  02/18/2023: In sitting:  25-115 deg After stretching 15 to 120 deg  8/14:  Supine  12-131 8/16: seated knee extension -13 degrees   LOWER EXTREMITY MMT: + extension lag with SLR and LAQ Unable to SLS on right without bil UE support (left side 10 sec without UE support) Able to rise sit to stand without UE support but significant weight shift towards the left  MMT Right eval  Left eval 7/26 right 8/27 right  Hip flexion 5 5    Hip extension 4 5 4+ 5  Hip abduction 4 5 4 5   Hip adduction      Hip internal rotation      Hip external rotation      Knee flexion 4 5 4 4   Knee extension 4- 5 4- 4  Ankle dorsiflexion      Ankle plantarflexion      Ankle inversion      Ankle eversion       (Blank rows = not tested)  FUNCTIONAL TESTS:  Eval: 5x sit to stand: no hands but decreased WB on right 16.57 TUG with no hands, SPC 11.61 complains of pain upon rising 1st few steps  02/18/2023: 5 times sit to stand:  12.60 sec without UE use (decreased weight bearing through RLE) Timed up and Go (TUG):  16.82 sec with SPC 3 minute walk test:  480 ft with SPC  8/16: 5x to stand 11.1 2 min walk 386 feet with one instance of a knee jolt  GAIT:  Comments: pt using SPC on surgical side;  discussed using in hand on non-affected side for optimal safety (history of knee instability) Up and down 4 steps reciprocally but needs bil railings for support   TODAY'S TREATMENT:  DATE: 03/19/2023 Nustep level 3 x5  min with PT present to discuss status Dynamic movement at the counter: tip toe reach, side step, retro lunge, side taps 10# suitcase carries 2 laps 10# KB reach down 5x right/left  2nd step knee flexion stretch 15x HS stretch on 2nd step 5x 15x 6 inch step up with 2 hand rails  15x 6 inch step downs Single leg heel raises (difficult on left had surgery a few years ago to release tendon) Floor transfers with mat assist to rise (able to put pressure on left knee) Leg press seat 7 80# 15x good slow eccentric control,   right only40# 15x  DATE: 03/15/2023 Nustep level 5 x5  min with PT present to discuss status 2nd step knee flexion stretch 15x HS stretch on 2nd step 5x 20x 6 inch step up with 2 hand rails  WB on right with 12 inch step tap 15x  Ladder walking: high step, side step, in/out of boxes 2 laps each Sit to stand from chair with blue power cord around  right knee with sit to stand 10x TKEs blue power cord 10x 5 sec hold Stand on foam in staggered stance with head turns, reaching and eyes closed Leg press seat 7 80# 15x good slow eccentric control,  40# 15x  DATE: 03/13/2023 Nustep level 5 x5  min with PT present to discuss status 2nd step knee flexion stretch 15x HS stretch on 2nd step 5x 20x 6 inch step up with 2 hand rails  WB on right 3 level step taps 10x Rocker board 2  min Step over 3 hurdles with min to no UE support 6x Sit to stand from 20 inch box with blue power cord around right knee with sit to stand 10x Dynamic balance with WB on surgery leg with other leg circle taps without UE support WB on right with hip hinge followed by 18 inch step taps 15x Leg press seat 7 80# 10x good slow eccentric control,  40# 11x  DATE: 03/08/2023 Nustep level 5 x5  min with PT present to discuss status Sit to stand 5x test 2 min walk test Next to the counter: high step, stepping over objects, backwards walk, side stepping Rocker board 2 min 6 inch step up at the steps 10x 2 hand support 6 inch lateral step up (uncomfortable) 7x 6 inch step downs 2 hand support 2x10 WB on right with 16 inch step taps 15x Leg press seat 7 75# 15x good slow eccentric control,  30# 15x  PATIENT EDUCATION:  Education details: Educated patient on anatomy and physiology of current symptoms, prognosis, plan of care as well as initial self care strategies to promote recovery Person educated: Patient Education method: Explanation Education comprehension: verbalized understanding  HOME EXERCISE PROGRAM: Access Code: [email or web address] URL: [email or web address] Date: 02/18/2023 Prepared by: Clydie Braun Menke  Exercises - Seated Passive Knee Extension  - 1 x daily - 7 x weekly - 1 sets - 10 reps - Seated Long Arc Quad with Ankle Weight  - 1 x daily - 7 x weekly - 2 sets - 10 reps - Sit to Stand  - 1 x daily - 7 x weekly - 1 sets - 10 reps - Seated Quad Set   - 1 x daily - 7 x weekly - 2 sets - 10 reps  Discussed emphasis on achieving full knee extension ROM:  suggested every 2 hours help the knee get as straight as possible for 2-3 min using hands or a weight on thigh Will start a progressive HEP appropriate for her current level but has a HEP from previous facility Husband recorded videos of ex's on 7/19: LAQ 5#, seated hang with 5# on thigh, stairs stretching  ASSESSMENT:  CLINICAL IMPRESSION: Decreasing reliance on the cane.  No complaints of pain or signs of instability today. Quad strength improving steadily.  Gastroc muscle atrophy noted but pt reports having a surgical procedure on this a few years ago.  She is able to put some pressure on her knee to do a floor transfer with some UE assist on the mat table needed to rise.     OBJECTIVE IMPAIRMENTS: decreased activity tolerance, decreased balance, decreased mobility, difficulty walking, decreased ROM, decreased strength, increased edema, impaired perceived functional ability, impaired flexibility, and pain.   ACTIVITY LIMITATIONS: lifting, bending, standing, squatting, sleeping, stairs, transfers, bed mobility, and locomotion level  PARTICIPATION LIMITATIONS: shopping, community activity, and yard work  PERSONAL FACTORS: Good general health positively affect patient's functional outcome.   REHAB POTENTIAL: Good  CLINICAL DECISION MAKING: Stable/uncomplicated  EVALUATION COMPLEXITY: Low   GOALS: Goals reviewed with patient? Yes  SHORT TERM GOALS: Target date:03/06/2023   The patient will demonstrate knowledge of basic self care strategies and exercises to promote healing  Baseline: Goal status: met 7/26  2.  Right knee extension to 10 degrees needed for a normalized, more efficient gait pattern Baseline:  Goal status: ongoing  3.  Right knee flexion to 125 degrees for greater ease descending steps Baseline:  Goal status: met 7/23  4.  Improved quad strength to 4/5  needed  for sit to stand transitions and in/out of the car with less pain Baseline:  Goal status: met 8/27 5.  5x sit to stand improved to 14.5 sec Baseline:  Goal status: MET on 02/18/2023   LONG TERM GOALS: Target date: 04/03/2023  The patient will be independent in a safe self progression of a home exercise program to promote further recovery of function and relieve pain Baseline:  Goal status: INITIAL  2.  Right knee flexion 130 degrees needed for stooping for taking care of her dog and ease with descending curbs/steps Baseline:  Goal status: INITIAL  3.  Right knee extension to 6 degrees needed for balance/agility and gait safety for longer distance walking Baseline:  Goal status: INITIAL  4.  Right LE strength grossly 4+/5 needed for full weight acceptance when negotiating curbs, steps and with sit to stand transitions Baseline:  Goal status: INITIAL  5.  The patient will be able to get up off the floor/ground for gardening Baseline:  Goal status: INITIAL    PLAN:  PT FREQUENCY: 2x/week  PT DURATION: 8 weeks  PLANNED INTERVENTIONS: Therapeutic exercises, Therapeutic activity, Neuromuscular re-education, Balance training, Gait training, Patient/Family education, Self Care, Joint mobilization, Aquatic Therapy, Electrical stimulation, Cryotherapy, Moist heat, Taping, Vasopneumatic device, Manual therapy, and Re-evaluation  PLAN FOR NEXT SESSION: floor transfer work up to getting up without pulling up from mat;   leg press;  Nu-Step for ROM for knee extension;  emphasize quad strengthening; progressive proprioceptive ex's for full WB on right;  doesn't like cold (hold on vasocompression)  Lavinia Sharps, PT 03/19/23 7:26 PM Phone: [phone] Fax: [phone]       [facility] [address] [place], [place] [address] Phone # [phone] Fax [phone]

## 2023-03-21 ENCOUNTER — Ambulatory Visit: Payer: Medicare Other | Admitting: Physical Therapy

## 2023-03-21 DIAGNOSIS — R262 Difficulty in walking, not elsewhere classified: Secondary | ICD-10-CM

## 2023-03-21 DIAGNOSIS — M25561 Pain in right knee: Secondary | ICD-10-CM | POA: Diagnosis not present

## 2023-03-21 DIAGNOSIS — M25661 Stiffness of right knee, not elsewhere classified: Secondary | ICD-10-CM

## 2023-03-21 NOTE — Therapy (Signed)
OUTPATIENT PHYSICAL THERAPY LOWER EXTREMITY PROGRESS NOTE   Patient Name: Misty Dodson MRN: [id number] DOB:07-30-1935, 87 y.o., female Today's Date: 03/21/2023  END OF SESSION:  PT End of Session - 03/21/23 1148     Visit Number 14    Date for PT Re-Evaluation 04/03/23    Authorization Type BCBS Medicare    PT Start Time 1145    PT Stop Time 1230    PT Time Calculation (min) 45 min    Activity Tolerance Patient tolerated treatment well               Past Medical History:  Diagnosis Date   Anxiety    occasional "panic attacks"   Family history of anesthesia complication    son's "heart stopped" during knee repair age 49Yrs   GERD (gastroesophageal reflux disease)    Heart murmur    Hypertension    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   Past Surgical History:  Procedure Laterality Date   ABDOMINAL HYSTERECTOMY     ANTERIOR AND POSTERIOR REPAIR N/A 02/23/2014   Procedure: ANTERIOR (CYSTOCELE) AND POSTERIOR REPAIR (RECTOCELE);  Surgeon: Misty Rein, MD;  Location: [facility];  Service: Gynecology;  Laterality: N/A;   BACK SURGERY     "several times"   CYSTOSCOPY N/A 02/23/2014   Procedure: CYSTOSCOPY;  Surgeon: Misty Sinner, MD;  Location: [facility];  Service: Urology;  Laterality: N/A;   FOOT SURGERY     PUBOVAGINAL SLING N/A 02/23/2014   Procedure: Leonides Grills;  Surgeon: Misty Sinner, MD;  Location: [facility];  Service: Urology;  Laterality: N/A;   TONSILLECTOMY     Patient Active Problem List   Diagnosis Date Noted   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 Pt did not bring her cane into the clinic today.  We leave for [facility] on 9/5.   Goes by "[name]" PERTINENT HISTORY: Right TKA on 11/06/2022 HTN; GERD, Right shoulder replacement ~3 years ago, hard of hearing (wears hearing aide)  PAIN:  Are you having pain? Yes NPRS scale: 2/10  Pain location: right lateral knee to lower leg Aggravating factors: sit to stand; turning in bed; in/out of car Relieving factors: no pain with sitting  PRECAUTIONS: Fall     WEIGHT BEARING RESTRICTIONS: No  FALLS:  Has patient fallen in last 6 months? Yes. Number of falls 1  LIVING ENVIRONMENT: Lives with: lives with husband Lives in: House/apartment Stairs: main living and bedroom downstairs; husband uses the space upstairs more than she does   OCCUPATION: retired  PLOF: Independent  PATIENT GOALS: I want this knee to quit hurting when I get up and down; I want it to feel stronger Upcoming trips: [facility] in October Vacation to [facility] Sept 5th Gardener (townhouse) I'm afraid I couldn't get back up from the ground Dtr lives in [place]. [place]    OBJECTIVE:   DIAGNOSTIC FINDINGS: right knee OA  Add on eval-not in FOTO  COGNITION: Overall cognitive status: Within functional limits for tasks assessed      EDEMA:  Mild anterior edema and puffiness posterior knee Incision appears well healed Patellar hypomobility in all directions  LOWER EXTREMITY ROM: Eval: Seated active right knee ROM:  17-110 degrees Supine active right knee ROM 15-115 degrees  (left 0-150 degrees) Decreased right HS muscle length  7/23:   supine 127, active supine 15, passive 11  02/18/2023: In sitting:  25-115 deg After stretching 15 to 120 deg  8/14:  Supine  12-131 8/16: seated knee extension -13 degrees   LOWER EXTREMITY MMT: + extension lag with SLR and LAQ Unable to SLS on right without bil UE support (left side 10 sec without UE support) Able to rise sit to stand without UE support but significant  weight shift towards the left  MMT Right eval Left eval 7/26 right 8/27 right  Hip flexion 5 5    Hip extension 4 5 4+ 5  Hip abduction 4 5 4 5   Hip adduction      Hip internal rotation      Hip external rotation      Knee flexion 4 5 4 4   Knee extension 4- 5 4- 4  Ankle dorsiflexion      Ankle plantarflexion      Ankle inversion      Ankle eversion       (Blank rows = not tested)  FUNCTIONAL TESTS:  Eval: 5x sit to stand: no hands but decreased WB on right 16.57 TUG with no hands, SPC 11.61 complains of pain upon rising 1st few steps  02/18/2023: 5 times sit to stand:  12.60 sec without UE use (decreased weight bearing through RLE) Timed up and Go (TUG):  16.82 sec with SPC 3 minute walk test:  480 ft with SPC  8/16: 5x to stand 11.1 2 min walk 386 feet with one instance of a knee jolt  GAIT:  Comments: pt using SPC on surgical side;  discussed using in hand on non-affected side for optimal safety (history of knee instability) Up and down 4 steps reciprocally but needs bil railings for support   TODAY'S TREATMENT:  DATE: 03/21/2023 Nustep Dodson 3 x5  min with PT present to discuss status 2nd step knee flexion stretch 15x HS stretch on 2nd step 5x 15x 6 inch step up with 2 hand rails  15x 6 inch step downs lopsided leg heel raises  Clock agility progressing to performing without UE support 8 inch step ups 5x Hurdles walk stepping over 4 laps Hurdles in box formation multiplane stepping Floor transfers with mat assist to rise (able to put pressure on left knee) Leg press seat 7 85# 20x good slow eccentric control,   right only 45# 10x  DATE: 03/19/2023 Nustep Dodson 3 x5  min with PT present to discuss status Dynamic movement at the counter: tip toe reach, side step, retro lunge, side taps 10# suitcase carries 2 laps 10# KB reach down 5x right/left  2nd step knee flexion stretch 15x HS stretch on 2nd step 5x 15x 6 inch step up with 2 hand rails  15x 6 inch  step downs Single leg heel raises (difficult on left had surgery a few years ago to release tendon) Floor transfers with mat assist to rise (able to put pressure on left knee) Leg press seat 7 80# 15x good slow eccentric control,   right only 40# 20x  DATE: 03/15/2023 Nustep Dodson 5 x5  min with PT present to discuss status 2nd step knee flexion stretch 15x HS stretch on 2nd step 5x 20x 6 inch step up with 2 hand rails  WB on right with 12 inch step  tap 15x  Ladder walking: high step, side step, in/out of boxes 2 laps each Sit to stand from chair with blue power cord around right knee with sit to stand 10x TKEs blue power cord 10x 5 sec hold Stand on foam in staggered stance with head turns, reaching and eyes closed Leg press seat 7 80# 15x good slow eccentric control,  40# 15x  DATE: 03/13/2023 Nustep Dodson 5 x5  min with PT present to discuss status 2nd step knee flexion stretch 15x HS stretch on 2nd step 5x 20x 6 inch step up with 2 hand rails  WB on right 3 Dodson step taps 10x Rocker board 2 min Step over 3 hurdles with min to no UE support 6x Sit to stand from 20 inch box with blue power cord around right knee with sit to stand 10x Dynamic balance with WB on surgery leg with other leg circle taps without UE support WB on right with hip hinge followed by 18 inch step taps 15x Leg press seat 7 80# 10x good slow eccentric control,  40# 11x PATIENT EDUCATION:  Education details: Educated patient on anatomy and physiology of current symptoms, prognosis, plan of care as well as initial self care strategies to promote recovery Person educated: Patient Education method: Explanation Education comprehension: verbalized understanding  HOME EXERCISE PROGRAM: Access Code: [email or web address] URL: [email or web address] Date: 02/18/2023 Prepared by: Clydie Braun Menke  Exercises - Seated Passive Knee Extension  - 1 x daily - 7 x weekly - 1 sets - 10 reps - Seated Long Arc Quad with Ankle  Weight  - 1 x daily - 7 x weekly - 2 sets - 10 reps - Sit to Stand  - 1 x daily - 7 x weekly - 1 sets - 10 reps - Seated Quad Set  - 1 x daily - 7 x weekly - 2 sets - 10 reps  Discussed emphasis on achieving full knee extension ROM:  suggested every 2 hours help the knee get as straight as possible for 2-3 min using hands or a weight on thigh Will start a progressive HEP appropriate for her current Dodson but has a HEP from previous facility Husband recorded videos of ex's on 7/19: LAQ 5#, seated hang with 5# on thigh, stairs stretching  ASSESSMENT:  CLINICAL IMPRESSION: Steady improvement in LE strength and ability to bear full weight on surgical side. Patient presented to the clinic without her cane today and walked a long distance yesterday without it.   Able to do a floor transfer with min use of Ues on mat table to rise.  Therapist monitoring response to all exercise and providing verbal cues as needed to optimize technique.     OBJECTIVE IMPAIRMENTS: decreased activity tolerance, decreased balance, decreased mobility, difficulty walking, decreased ROM, decreased strength, increased edema, impaired perceived functional ability, impaired flexibility, and pain.   ACTIVITY LIMITATIONS: lifting, bending, standing, squatting, sleeping, stairs, transfers, bed mobility, and locomotion Dodson  PARTICIPATION LIMITATIONS: shopping, community activity, and yard work  PERSONAL FACTORS: Good general health positively affect patient's functional outcome.   REHAB POTENTIAL: Good  CLINICAL DECISION MAKING: Stable/uncomplicated  EVALUATION COMPLEXITY: Low   GOALS: Goals reviewed with patient? Yes  SHORT TERM GOALS: Target date:03/06/2023   The patient will demonstrate knowledge of basic self care strategies and exercises to promote healing  Baseline: Goal status: met 7/26  2.  Right knee extension to 10 degrees needed for a normalized, more efficient gait pattern Baseline:  Goal status:  ongoing  3.  Right knee flexion to 125 degrees for greater ease descending steps Baseline:  Goal status: met 7/23  4.  Improved quad strength to 4/5 needed for sit to stand transitions and in/out of the car with less pain Baseline:  Goal status: met 8/27 5.  5x sit to stand improved to 14.5 sec Baseline:  Goal status: MET on 02/18/2023   LONG TERM GOALS: Target date: 04/03/2023  The patient will be independent in a safe self progression of a home exercise program to promote further recovery of function and relieve pain Baseline:  Goal status: INITIAL  2.  Right knee flexion 130 degrees needed for stooping for taking care of her dog and ease with descending curbs/steps Baseline:  Goal status: INITIAL  3.  Right knee extension to 6 degrees needed for balance/agility and gait safety for longer distance walking Baseline:  Goal status: INITIAL  4.  Right LE strength grossly 4+/5 needed for full weight acceptance when negotiating curbs, steps and with sit to stand transitions Baseline:  Goal status: INITIAL  5.  The patient will be able to get up off the floor/ground for gardening Baseline:  Goal status: INITIAL    PLAN:  PT FREQUENCY: 2x/week  PT DURATION: 8 weeks  PLANNED INTERVENTIONS: Therapeutic exercises, Therapeutic activity, Neuromuscular re-education, Balance training, Gait training, Patient/Family education, Self Care, Joint mobilization, Aquatic Therapy, Electrical stimulation, Cryotherapy, Moist heat, Taping, Vasopneumatic device, Manual therapy, and Re-evaluation  PLAN FOR NEXT SESSION: upcoming trip to [place]; floor transfer work up to getting up without pulling up from mat;   leg press;  Nu-Step for ROM for knee extension;  emphasize quad strengthening; progressive proprioceptive ex's for full WB on right;  doesn't like cold (hold on vasocompression)    Lavinia Sharps, PT 03/21/23 11:14 PM Phone: [phone] Fax: [phone]     [facility] [address] [place], [place] [address] Phone # [phone] Fax [phone]

## 2023-03-26 ENCOUNTER — Ambulatory Visit: Payer: Medicare Other | Attending: Sports Medicine | Admitting: Physical Therapy

## 2023-03-26 DIAGNOSIS — M25561 Pain in right knee: Secondary | ICD-10-CM | POA: Insufficient documentation

## 2023-03-26 DIAGNOSIS — R262 Difficulty in walking, not elsewhere classified: Secondary | ICD-10-CM | POA: Diagnosis present

## 2023-03-26 DIAGNOSIS — M25661 Stiffness of right knee, not elsewhere classified: Secondary | ICD-10-CM | POA: Diagnosis present

## 2023-03-26 NOTE — Therapy (Signed)
OUTPATIENT PHYSICAL THERAPY LOWER EXTREMITY PROGRESS NOTE   Patient Name: Misty Dodson MRN: [id number] DOB:17-May-1936, 87 y.o., female Today's Date: 03/26/2023  END OF SESSION:  PT End of Session - 03/26/23 1110     Visit Number 15    Date for PT Re-Evaluation 04/03/23    Authorization Type BCBS Medicare    PT Start Time 1103    PT Stop Time 1145    PT Time Calculation (min) 42 min    Activity Tolerance Patient tolerated treatment well               Past Medical History:  Diagnosis Date   Anxiety    occasional "panic attacks"   Family history of anesthesia complication    son's "heart stopped" during knee repair age 73Yrs   GERD (gastroesophageal reflux disease)    Heart murmur    Hypertension    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   Past Surgical History:  Procedure Laterality Date   ABDOMINAL HYSTERECTOMY     ANTERIOR AND POSTERIOR REPAIR N/A 02/23/2014   Procedure: ANTERIOR (CYSTOCELE) AND POSTERIOR REPAIR (RECTOCELE);  Surgeon: Sherian Rein, MD;  Location: [facility];  Service: Gynecology;  Laterality: N/A;   BACK SURGERY     "several times"   CYSTOSCOPY N/A 02/23/2014   Procedure: CYSTOSCOPY;  Surgeon: Martina Sinner, MD;  Location: [facility];  Service: Urology;  Laterality: N/A;   FOOT SURGERY     PUBOVAGINAL SLING N/A 02/23/2014   Procedure: Leonides Grills;  Surgeon: Martina Sinner, MD;  Location: [facility];  Service: Urology;  Laterality: N/A;   TONSILLECTOMY     Patient Active Problem List   Diagnosis Date Noted   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    PCP: [name] MD  REFERRING PROVIDER: [name] MD  REFERRING DIAG: [phone] s/p TKR right  THERAPY DIAG:  Right knee pain; right knee stiffness; weakness; difficulty in walking Rationale for Evaluation and Treatment: Rehabilitation  ONSET DATE: October 2023 injury, TKR 11/06/22  SUBJECTIVE:   SUBJECTIVE STATEMENT: Walked 1.5 another time  without the cane and did not bring her cane into the clinic today.    Goes by "Misty Dodson" PERTINENT HISTORY: Right TKA on 11/06/2022 HTN; GERD, Right shoulder replacement ~3 years ago, hard of hearing (wears hearing aide)  PAIN:  Are you having pain? Yes NPRS scale: 1/10  Pain location: right lateral knee to lower leg Aggravating factors: sit to stand; turning in bed; in/out of car Relieving factors: no pain with sitting  PRECAUTIONS: Fall     WEIGHT BEARING RESTRICTIONS: No  FALLS:  Has patient fallen in last 6 months? Yes. Number of falls 1  LIVING ENVIRONMENT: Lives with: lives with husband Lives in: House/apartment Stairs: main living and bedroom downstairs; husband uses the space upstairs more than she does   OCCUPATION: retired  PLOF: Independent  PATIENT GOALS: I want this knee to quit hurting when I get up and down; I want it to feel stronger Upcoming trips: [facility] in October Vacation to [facility] Sept 5th Gard[place] (townhouse) I'm afraid I couldn't get back up from the ground Dtr lives in [place]. [place]    OBJECTIVE:   DIAGNOSTIC FINDINGS: right knee OA  Add on eval-not in FOTO  COGNITION: Overall cognitive status: Within functional limits for tasks assessed      EDEMA:  Mild anterior edema and puffiness posterior knee Incision appears well healed Patellar hypomobility in all directions  LOWER EXTREMITY ROM: Eval:  Seated active right knee ROM:  17-110 degrees Supine active right knee ROM 15-115 degrees  (left 0-150 degrees) Decreased right HS muscle length  7/23:   supine 127, active supine 15, passive 11  02/18/2023: In sitting:  25-115 deg After stretching 15 to 120 deg  8/14:  Supine  12-131 8/16: seated knee extension -13 degrees   9/3:  10-135   LOWER EXTREMITY MMT: + extension lag with SLR and LAQ Unable to SLS on right without bil UE support (left side 10 sec without UE support) Able to rise sit to stand without UE support but significant  weight shift towards the left  MMT Right eval Left eval 7/26 right 8/27 right  Hip flexion 5 5    Hip extension 4 5 4+ 5  Hip abduction 4 5 4 5   Hip adduction      Hip internal rotation      Hip external rotation      Knee flexion 4 5 4 4   Knee extension 4- 5 4- 4  Ankle dorsiflexion      Ankle plantarflexion      Ankle inversion      Ankle eversion       (Blank rows = not tested)  FUNCTIONAL TESTS:  Eval: 5x sit to stand: no hands but decreased WB on right 16.57 TUG with no hands, SPC 11.61 complains of pain upon rising 1st few steps  02/18/2023: 5 times sit to stand:  12.60 sec without UE use (decreased weight bearing through RLE) Timed up and Go (TUG):  16.82 sec with SPC 3 minute walk test:  480 ft with SPC  8/16: 5x to stand 11.1 2 min walk 386 feet with one instance of a knee jolt  GAIT:  Comments: pt using SPC on surgical side;  discussed using in hand on non-affected side for optimal safety (history of knee instability) Up and down 4 steps reciprocally but needs bil railings for support   TODAY'S TREATMENT:  DATE: 03/26/2023 Nustep level 3 x5  min with PT present to discuss status 6 inch up and over curb no UE use Ladder walk with toe cone scoot 3 laps with CGA Sit to stand with blue power cord behind right knee 20x 10# KB dead lifts 10x 10# suitcase lifts 10x 2nd step knee flexion stretch 15x HS stretch on 2nd step 5x 15x 6 inch step up with no hand rails  Floor transfers with mat assist to rise (able to put pressure on left knee) Leg press seat 7 90# 20x good slow eccentric control,   right only 45# 10x  DATE: 03/21/2023 Nustep level 3 x5  min with PT present to discuss status 2nd step knee flexion stretch 15x HS stretch on 2nd step 5x 15x 6 inch step up with 2 hand rails  15x 6 inch step downs lopsided leg heel raises  Clock agility progressing to performing without UE support 8 inch step ups 5x Hurdles walk stepping over 4 laps Hurdles in box  formation multiplane stepping Floor transfers with mat assist to rise (able to put pressure on left knee) Leg press seat 7 85# 20x good slow eccentric control,   right only 45# 10x  DATE: 03/19/2023 Nustep level 3 x5  min with PT present to discuss status Dynamic movement at the counter: tip toe reach, side step, retro lunge, side taps 10# suitcase carries 2 laps 10# KB reach down 5x right/left  2nd step knee flexion stretch 15x HS stretch on 2nd  step 5x 15x 6 inch step up with 2 hand rails  15x 6 inch step downs Single leg heel raises (difficult on left had surgery a few years ago to release tendon) Floor transfers with mat assist to rise (able to put pressure on left knee) Leg press seat 7 80# 15x good slow eccentric control,   right only 40# 20x  DATE: 03/15/2023 Nustep level 5 x5  min with PT present to discuss status 2nd step knee flexion stretch 15x HS stretch on 2nd step 5x 20x 6 inch step up with 2 hand rails  WB on right with 12 inch step tap 15x  Ladder walking: high step, side step, in/out of boxes 2 laps each Sit to stand from chair with blue power cord around right knee with sit to stand 10x TKEs blue power cord 10x 5 sec hold Stand on foam in staggered stance with head turns, reaching and eyes closed Leg press seat 7 80# 15x good slow eccentric control,  40# 15x  PATIENT EDUCATION:  Education details: Educated patient on anatomy and physiology of current symptoms, prognosis, plan of care as well as initial self care strategies to promote recovery Person educated: Patient Education method: Explanation Education comprehension: verbalized understanding  HOME EXERCISE PROGRAM: Access Code: [email or web address] URL: [email or web address] Date: 02/18/2023 Prepared by: Clydie Braun Menke  Exercises - Seated Passive Knee Extension  - 1 x daily - 7 x weekly - 1 sets - 10 reps - Seated Long Arc Quad with Ankle Weight  - 1 x daily - 7 x weekly - 2 sets - 10 reps - Sit to  Stand  - 1 x daily - 7 x weekly - 1 sets - 10 reps - Seated Quad Set  - 1 x daily - 7 x weekly - 2 sets - 10 reps  Discussed emphasis on achieving full knee extension ROM:  suggested every 2 hours help the knee get as straight as possible for 2-3 min using hands or a weight on thigh Will start a progressive HEP appropriate for her current level but has a HEP from previous facility Husband recorded videos of ex's on 7/19: LAQ 5#, seated hang with 5# on thigh, stairs stretching  ASSESSMENT:  CLINICAL IMPRESSION: Misty Dodson continues to make excellent progress with gait without use of her cane, quad strength needed for greater ease with sit to stand and balance/agility of her right LE.  Decreasing reliance of UE support with 6 inch steps and curbs.  No complaints of pain during the session today.  Anticipate readiness for discharge in 3-4 visits.    OBJECTIVE IMPAIRMENTS: decreased activity tolerance, decreased balance, decreased mobility, difficulty walking, decreased ROM, decreased strength, increased edema, impaired perceived functional ability, impaired flexibility, and pain.   ACTIVITY LIMITATIONS: lifting, bending, standing, squatting, sleeping, stairs, transfers, bed mobility, and locomotion level  PARTICIPATION LIMITATIONS: shopping, community activity, and yard work  PERSONAL FACTORS: Good general health positively affect patient's functional outcome.   REHAB POTENTIAL: Good  CLINICAL DECISION MAKING: Stable/uncomplicated  EVALUATION COMPLEXITY: Low   GOALS: Goals reviewed with patient? Yes  SHORT TERM GOALS: Target date:03/06/2023   The patient will demonstrate knowledge of basic self care strategies and exercises to promote healing  Baseline: Goal status: met 7/26  2.  Right knee extension to 10 degrees needed for a normalized, more efficient gait pattern Baseline:  Goal status: ongoing  3.  Right knee flexion to 125 degrees for greater ease descending steps Baseline:  Goal  status: met 7/23  4.  Improved quad strength to 4/5 needed for sit to stand transitions and in/out of the car with less pain Baseline:  Goal status: met 8/27 5.  5x sit to stand improved to 14.5 sec Baseline:  Goal status: MET on 02/18/2023   LONG TERM GOALS: Target date: 04/03/2023  The patient will be independent in a safe self progression of a home exercise program to promote further recovery of function and relieve pain Baseline:  Goal status: ongoing  2.  Right knee flexion 130 degrees needed for stooping for taking care of her dog and ease with descending curbs/steps Baseline:  Goal status: met 9/3  3.  Right knee extension to 6 degrees needed for balance/agility and gait safety for longer distance walking Baseline:  Goal status: ongoing  4.  Right LE strength grossly 4+/5 needed for full weight acceptance when negotiating curbs, steps and with sit to stand transitions Baseline:  Goal status: ongoing  5.  The patient will be able to get up off the floor/ground for gardening Baseline:  Goal status: met 9/3   PLAN:  PT FREQUENCY: 2x/week  PT DURATION: 8 weeks  PLANNED INTERVENTIONS: Therapeutic exercises, Therapeutic activity, Neuromuscular re-education, Balance training, Gait training, Patient/Family education, Self Care, Joint mobilization, Aquatic Therapy, Electrical stimulation, Cryotherapy, Moist heat, Taping, Vasopneumatic device, Manual therapy, and Re-evaluation  PLAN FOR NEXT SESSION:follow up after trip to [place]; try to get appt for 9/17 or 9/19;  floor transfer work up to getting up without pulling up from mat;   leg press;  Nu-Step for ROM for knee extension;  emphasize quad strengthening; progressive proprioceptive ex's for full WB on right    Lavinia Sharps, PT 03/26/23 6:04 PM Phone: [phone] Fax: [phone]   [facility] [address] [place], [place] [address] Phone # [phone] Fax [phone]

## 2023-04-02 ENCOUNTER — Ambulatory Visit: Payer: Medicare Other | Admitting: Physical Therapy

## 2023-04-02 DIAGNOSIS — M25561 Pain in right knee: Secondary | ICD-10-CM

## 2023-04-02 DIAGNOSIS — M25661 Stiffness of right knee, not elsewhere classified: Secondary | ICD-10-CM

## 2023-04-02 DIAGNOSIS — R262 Difficulty in walking, not elsewhere classified: Secondary | ICD-10-CM

## 2023-04-02 NOTE — Therapy (Signed)
OUTPATIENT PHYSICAL THERAPY LOWER EXTREMITY PROGRESS NOTE/RECERTIFICATION   Patient Name: Misty Dodson MRN: [id number] DOB:23-Sep-1935, 87 y.o., female Today's Date: 04/02/2023  END OF SESSION:  PT End of Session - 04/02/23 1147     Visit Number 16    Date for PT Re-Evaluation 04/26/23    Authorization Type BCBS Medicare    PT Start Time 1145    PT Stop Time 1230    PT Time Calculation (min) 45 min    Activity Tolerance Patient tolerated treatment well               Past Medical History:  Diagnosis Date   Anxiety    occasional "panic attacks"   Family history of anesthesia complication    son's "heart stopped" during knee repair age 36Yrs   GERD (gastroesophageal reflux disease)    Heart murmur    Hypertension    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   Past Surgical History:  Procedure Laterality Date   ABDOMINAL HYSTERECTOMY     ANTERIOR AND POSTERIOR REPAIR N/A 02/23/2014   Procedure: ANTERIOR (CYSTOCELE) AND POSTERIOR REPAIR (RECTOCELE);  Surgeon: Sherian Rein, MD;  Location: [facility];  Service: Gynecology;  Laterality: N/A;   BACK SURGERY     "several times"   CYSTOSCOPY N/A 02/23/2014   Procedure: CYSTOSCOPY;  Surgeon: Martina Sinner, MD;  Location: [facility];  Service: Urology;  Laterality: N/A;   FOOT SURGERY     PUBOVAGINAL SLING N/A 02/23/2014   Procedure: Leonides Grills;  Surgeon: Martina Sinner, MD;  Location: [facility];  Service: Urology;  Laterality: N/A;   TONSILLECTOMY     Patient Active Problem List   Diagnosis Date Noted   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    PCP: [name] MD  REFERRING PROVIDER: [name] MD  REFERRING DIAG: [phone] s/p TKR right  THERAPY DIAG:  Right knee pain; right knee stiffness; weakness; difficulty in walking Rationale for Evaluation and Treatment: Rehabilitation  ONSET DATE: October 2023 injury, TKR 11/06/22  SUBJECTIVE:   SUBJECTIVE STATEMENT: I used a  wheelchair in the airport in [place]. My bothered me while on the plane.   I walked around the hotel just fine.     Goes by "[name]" PERTINENT HISTORY: Right TKA on 11/06/2022 HTN; GERD, Right shoulder replacement ~3 years ago, hard of hearing (wears hearing aide)  PAIN:  Are you having pain? Yes NPRS scale: 1/10  Pain location: right lateral knee to lower leg Aggravating factors: sit to stand; turning in bed; in/out of car Relieving factors: no pain with sitting  PRECAUTIONS: Fall     WEIGHT BEARING RESTRICTIONS: No  FALLS:  Has patient fallen in last 6 months? Yes. Number of falls 1  LIVING ENVIRONMENT: Lives with: lives with husband Lives in: House/apartment Stairs: main living and bedroom downstairs; husband uses the space upstairs more than she does   OCCUPATION: retired  PLOF: Independent  PATIENT GOALS: I want this knee to quit hurting when I get up and down; I want it to feel stronger Upcoming trips: [facility] in October Vacation to [facility] Sept 5th Gard[place] (townhouse) I'm afraid I couldn't get back up from the ground Dtr lives in [place]. [place]    OBJECTIVE:   DIAGNOSTIC FINDINGS: right knee OA  Add on eval-not in FOTO  COGNITION: Overall cognitive status: Within functional limits for tasks assessed      EDEMA:  Mild anterior edema and puffiness posterior knee Incision appears well healed Patellar  hypomobility in all directions  LOWER EXTREMITY ROM: Eval: Seated active right knee ROM:  17-110 degrees Supine active right knee ROM 15-115 degrees  (left 0-150 degrees) Decreased right HS muscle length  7/23:   supine 127, active supine 15, passive 11  02/18/2023: In sitting:  25-115 deg After stretching 15 to 120 deg  8/14:  Supine  12-131 8/16: seated knee extension -13 degrees   9/3:  10-135   9/10:  11-  135  LOWER EXTREMITY MMT: + extension lag with SLR and LAQ Unable to SLS on right without bil UE support (left side 10 sec without UE  support) Able to rise sit to stand without UE support but significant weight shift towards the left  MMT Right eval Left eval 7/26 right 8/27 right 9/10 Right   Hip flexion 5 5     Hip extension 4 5 4+ 5 5  Hip abduction 4 5 4 5 5   Hip adduction       Hip internal rotation       Hip external rotation       Knee flexion 4 5 4 4    Knee extension 4- 5 4- 4   Ankle dorsiflexion       Ankle plantarflexion       Ankle inversion       Ankle eversion        (Blank rows = not tested)  FUNCTIONAL TESTS:  Eval: 5x sit to stand: no hands but decreased WB on right 16.57 TUG with no hands, SPC 11.61 complains of pain upon rising 1st few steps  02/18/2023: 5 times sit to stand:  12.60 sec without UE use (decreased weight bearing through RLE) Timed up and Go (TUG):  16.82 sec with SPC 3 minute walk test:  480 ft with SPC  8/16: 5x to stand 11.1 2 min walk 386 feet with one instance of a knee jolt    GAIT:  Comments: pt using SPC on surgical side;  discussed using in hand on non-affected side for optimal safety (history of knee instability) Up and down 4 steps reciprocally but needs bil railings for support   TODAY'S TREATMENT:  DATE: 04/02/2023 Nustep level 3 x5  min with PT present to discuss status 2nd step knee flexion 10x 2nd step HS stretch 10x Resisted BW walk 15# cable 10x Rocker board 2 min 15x 8 inch step up with  single arm support Step downs 8 inches 10x Manual therapy: supine knee extension mobs grade 3/4 5x 15 sec; passive HS stretch in supine; passive knee flexion 5x Leg press seat 7 90# 20x good slow eccentric control,   right only increased to 50# 13x  DATE: 03/26/2023 Nustep level 3 x5  min with PT present to discuss status 6 inch up and over curb no UE use Ladder walk with toe cone scoot 3 laps with CGA Sit to stand with blue power cord behind right knee 20x 10# KB dead lifts 10x 10# suitcase lifts 10x 2nd step knee flexion stretch 15x HS stretch on 2nd  step 5x 15x 6 inch step up with no hand rails  Floor transfers with mat assist to rise (able to put pressure on left knee) Leg press seat 7 90# 20x good slow eccentric control,   right only 45# 10x  DATE: 03/21/2023 Nustep level 3 x5  min with PT present to discuss status 2nd step knee flexion stretch 15x HS stretch on 2nd step 5x 15x 6 inch step up with  2 hand rails  15x 6 inch step downs lopsided leg heel raises  Clock agility progressing to performing without UE support 8 inch step ups 5x Hurdles walk stepping over 4 laps Hurdles in box formation multiplane stepping Floor transfers with mat assist to rise (able to put pressure on left knee) Leg press seat 7 85# 20x good slow eccentric control,   right only 45# 10x  DATE: 03/19/2023 Nustep level 3 x5  min with PT present to discuss status Dynamic movement at the counter: tip toe reach, side step, retro lunge, side taps 10# suitcase carries 2 laps 10# KB reach down 5x right/left  2nd step knee flexion stretch 15x HS stretch on 2nd step 5x 15x 6 inch step up with 2 hand rails  15x 6 inch step downs Single leg heel raises (difficult on left had surgery a few years ago to release tendon) Floor transfers with mat assist to rise (able to put pressure on left knee) Leg press seat 7 80# 15x good slow eccentric control,   right only 40# 20x  DATE: 03/15/2023 Nustep level 5 x5  min with PT present to discuss status 2nd step knee flexion stretch 15x HS stretch on 2nd step 5x 20x 6 inch step up with 2 hand rails  WB on right with 12 inch step tap 15x  Ladder walking: high step, side step, in/out of boxes 2 laps each Sit to stand from chair with blue power cord around right knee with sit to stand 10x TKEs blue power cord 10x 5 sec hold Stand on foam in staggered stance with head turns, reaching and eyes closed Leg press seat 7 80# 15x good slow eccentric control,  40# 15x  PATIENT EDUCATION:  Education details: Educated patient on  anatomy and physiology of current symptoms, prognosis, plan of care as well as initial self care strategies to promote recovery Person educated: Patient Education method: Explanation Education comprehension: verbalized understanding  HOME EXERCISE PROGRAM: Access Code: [email or web address] URL: [email or web address] Date: 02/18/2023 Prepared by: Clydie Braun Menke  Exercises - Seated Passive Knee Extension  - 1 x daily - 7 x weekly - 1 sets - 10 reps - Seated Long Arc Quad with Ankle Weight  - 1 x daily - 7 x weekly - 2 sets - 10 reps - Sit to Stand  - 1 x daily - 7 x weekly - 1 sets - 10 reps - Seated Quad Set  - 1 x daily - 7 x weekly - 2 sets - 10 reps  Discussed emphasis on achieving full knee extension ROM:  suggested every 2 hours help the knee get as straight as possible for 2-3 min using hands or a weight on thigh Will start a progressive HEP appropriate for her current level but has a HEP from previous facility Husband recorded videos of ex's on 7/19: LAQ 5#, seated hang with 5# on thigh, stairs stretching  ASSESSMENT:  CLINICAL IMPRESSION: Although the patient reports she is exceptionally tired with some knee pain after her trip to [facility], she did well overall while on this trip.  She is not using her cane today for ambulation.  Knee flexion ROM is very good at 135 degrees.  Limited passive knee extension ROM -11 degrees, it is not a limiting factor.  Her quad and HS strength is steadily improving although eccentric motor control impaired with stepping down from 6 and 8 inch step heights.  The patient would benefit from a continuation of skilled PT for a further  progression of strengthening and functional mobility.  Will continue to update and promote independence in a HEP needed for a return to the highest functional level possible with ADLs.     OBJECTIVE IMPAIRMENTS: decreased activity tolerance, decreased balance, decreased mobility, difficulty walking, decreased ROM,  decreased strength, increased edema, impaired perceived functional ability, impaired flexibility, and pain.   ACTIVITY LIMITATIONS: lifting, bending, standing, squatting, sleeping, stairs, transfers, bed mobility, and locomotion level  PARTICIPATION LIMITATIONS: shopping, community activity, and yard work  PERSONAL FACTORS: Good general health positively affect patient's functional outcome.   REHAB POTENTIAL: Good  CLINICAL DECISION MAKING: Stable/uncomplicated  EVALUATION COMPLEXITY: Low   GOALS: Goals reviewed with patient? Yes  SHORT TERM GOALS: Target date:03/06/2023   The patient will demonstrate knowledge of basic self care strategies and exercises to promote healing  Baseline: Goal status: met 7/26  2.  Right knee extension to 10 degrees needed for a normalized, more efficient gait pattern Baseline:  Goal status: ongoing  3.  Right knee flexion to 125 degrees for greater ease descending steps Baseline:  Goal status: met 7/23  4.  Improved quad strength to 4/5 needed for sit to stand transitions and in/out of the car with less pain Baseline:  Goal status: met 8/27 5.  5x sit to stand improved to 14.5 sec Baseline:  Goal status: MET on 02/18/2023   LONG TERM GOALS: Target date: 04/26/2023  The patient will be independent in a safe self progression of a home exercise program to promote further recovery of function and relieve pain Baseline:  Goal status: ongoing  2.  Right knee flexion 130 degrees needed for stooping for taking care of her dog and ease with descending curbs/steps Baseline:  Goal status: met 9/3  3.  Right knee extension to 6 degrees needed for balance/agility and gait safety for longer distance walking Baseline:  Goal status: ongoing  4.  Right LE strength grossly 4+/5 needed for full weight acceptance when negotiating curbs, steps and with sit to stand transitions Baseline:  Goal status: ongoing  5.  The patient will be able to get up off  the floor/ground for gardening Baseline:  Goal status: met 9/3   PLAN:  PT FREQUENCY:1- 2x/week  PT DURATION: 4 weeks  PLANNED INTERVENTIONS: Therapeutic exercises, Therapeutic activity, Neuromuscular re-education, Balance training, Gait training, Patient/Family education, Self Care, Joint mobilization, Aquatic Therapy, Electrical stimulation, Cryotherapy, Moist heat, Taping, Vasopneumatic device, Manual therapy, and Re-evaluation  PLAN FOR NEXT SESSION:increase bil leg press to 100#;   try to get appt for 9/17 or 9/19;  floor transfer work up to getting up without pulling up from mat;   leg press;  Nu-Step for ROM for knee extension;  emphasize quad strengthening; progressive proprioceptive ex's for full WB on right  Lavinia Sharps, PT 04/02/23 6:45 PM Phone: [phone] Fax: [phone]         [facility] [address] [place], [place] [address] Phone # [phone] Fax [phone]

## 2023-04-04 ENCOUNTER — Ambulatory Visit: Payer: Medicare Other | Admitting: Physical Therapy

## 2023-04-04 DIAGNOSIS — M25561 Pain in right knee: Secondary | ICD-10-CM

## 2023-04-04 DIAGNOSIS — M25661 Stiffness of right knee, not elsewhere classified: Secondary | ICD-10-CM

## 2023-04-04 DIAGNOSIS — R262 Difficulty in walking, not elsewhere classified: Secondary | ICD-10-CM

## 2023-04-04 NOTE — Therapy (Signed)
OUTPATIENT PHYSICAL THERAPY LOWER EXTREMITY PROGRESS NOTE   Patient Name: Misty Dodson MRN: [id number] DOB:1936-07-11, 87 y.o., female Today's Date: 04/04/2023  END OF SESSION:  PT End of Session - 04/04/23 1010     Visit Number 17    Date for PT Re-Evaluation 04/26/23    Authorization Type BCBS Medicare    PT Start Time 1011    PT Stop Time 1100    PT Time Calculation (min) 49 min    Activity Tolerance Patient tolerated treatment well               Past Medical History:  Diagnosis Date   Anxiety    occasional "panic attacks"   Family history of anesthesia complication    son's "heart stopped" during knee repair age 28Yrs   GERD (gastroesophageal reflux disease)    Heart murmur    Hypertension    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   Past Surgical History:  Procedure Laterality Date   ABDOMINAL HYSTERECTOMY     ANTERIOR AND POSTERIOR REPAIR N/A 02/23/2014   Procedure: ANTERIOR (CYSTOCELE) AND POSTERIOR REPAIR (RECTOCELE);  Surgeon: Sherian Rein, MD;  Location: [facility];  Service: Gynecology;  Laterality: N/A;   BACK SURGERY     "several times"   CYSTOSCOPY N/A 02/23/2014   Procedure: CYSTOSCOPY;  Surgeon: Martina Sinner, MD;  Location: [facility];  Service: Urology;  Laterality: N/A;   FOOT SURGERY     PUBOVAGINAL SLING N/A 02/23/2014   Procedure: Leonides Grills;  Surgeon: Martina Sinner, MD;  Location: [facility];  Service: Urology;  Laterality: N/A;   TONSILLECTOMY     Patient Active Problem List   Diagnosis Date Noted   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  I  woke up in the night with pain.  When I walk I don't think I walk straight.      Goes by "[name]" PERTINENT HISTORY: Right TKA on 11/06/2022 HTN; GERD, Right shoulder replacement ~3 years ago, hard of hearing (wears hearing aide)  PAIN:  Are you having pain? Yes NPRS scale: 1/10  Pain location: right lateral knee to lower leg Aggravating factors: sit to stand; turning in bed; in/out of car Relieving factors: no pain with sitting  PRECAUTIONS: Fall     WEIGHT BEARING RESTRICTIONS: No  FALLS:  Has patient fallen in last 6 months? Yes. Number of falls 1  LIVING ENVIRONMENT: Lives with: lives with husband Lives in: House/apartment Stairs: main living and bedroom downstairs; husband uses the space upstairs more than she does   OCCUPATION: retired  PLOF: Independent  PATIENT GOALS: I want this knee to quit hurting when I get up and down; I want it to feel stronger Upcoming trips: [facility] in October Vacation to [facility] Sept 5th Gardener (townhouse) I'm afraid I couldn't get back up from the ground Dtr lives in [place]. [place]    OBJECTIVE:   DIAGNOSTIC FINDINGS: right knee OA  Add on eval-not in FOTO  COGNITION: Overall cognitive status: Within functional limits for tasks assessed      EDEMA:  Mild anterior edema and puffiness posterior knee Incision appears well healed Patellar  hypomobility in all directions  LOWER EXTREMITY ROM: Eval: Seated active right knee ROM:  17-110 degrees Supine active right knee ROM 15-115 degrees  (left 0-150 degrees) Decreased right HS muscle length  7/23:   supine 127, active supine 15, passive 11  02/18/2023: In sitting:  25-115 deg After stretching 15 to 120 deg  8/14:  Supine  12-131 8/16: seated knee extension -13 degrees   9/3:  10-135   9/10:  11-  135  LOWER EXTREMITY MMT: + extension lag with SLR and LAQ Unable to SLS on right without bil UE support (left side 10 sec without UE support) Able to rise sit to stand without  UE support but significant weight shift towards the left  MMT Right eval Left eval 7/26 right 8/27 right 9/10 Right  9/12  Hip flexion 5 5      Hip extension 4 5 4+ 5 5   Hip abduction 4 5 4 5 5    Hip adduction        Hip internal rotation        Hip external rotation        Knee flexion 4 5 4 4   4+  Knee extension 4- 5 4- 4  4+  Ankle dorsiflexion        Ankle plantarflexion        Ankle inversion        Ankle eversion         (Blank rows = not tested)  FUNCTIONAL TESTS:  Eval: 5x sit to stand: no hands but decreased WB on right 16.57 TUG with no hands, SPC 11.61 complains of pain upon rising 1st few steps  02/18/2023: 5 times sit to stand:  12.60 sec without UE use (decreased weight bearing through RLE) Timed up and Go (TUG):  16.82 sec with SPC 3 minute walk test:  480 ft with SPC  8/16: 5x to stand 11.1 2 min walk 386 feet with one instance of a knee jolt  9/12 3 MWT 610 feet    GAIT:  Comments: pt using SPC on surgical side;  discussed using in hand on non-affected side for optimal safety (history of knee instability) Up and down 4 steps reciprocally but needs bil railings for support   TODAY'S TREATMENT:  DATE: 04/04/2023 Nustep level 3 x5  min with PT present to discuss status 6 inch step ups 10x 2 arm support  6 inch step downs 10x 2 arm support WB on right with 3 step taps 10x needs 2 fingertip support for most reps Clock toe taps without UE support 5 min Blue power cord standing TKEs 10x 5 sec holds Blue power cord around right knee with STS 3 sets of 5: no weight, then 5#, then 10# 4 inch step up and overs 5x single arm support 4 inch step up and balance 5x single arm support 4 inch step downs 5x single arm support  Yellow loop above knees with hip abduction/extension 10x right/left Yellow loop above knees with hip arcs 10x right/left 3 MWT no limp/symmetrical stance time Leg press seat 7 100# 10x good slow eccentric control,   right only increased  to 50# 10x DATE: 04/02/2023 Nustep level 3 x5  min with PT present to discuss status 2nd step knee flexion 10x 2nd step HS stretch 10x Resisted BW walk 15# cable 10x Rocker board 2 min 15x 8 inch step up with  single arm support Step downs 8 inches 10x Manual therapy: supine knee extension mobs  grade 3/4 5x 15 sec; passive HS stretch in supine; passive knee flexion 5x Leg press seat 7 90# 20x good slow eccentric control,   right only increased to 50# 13x  DATE: 03/26/2023 Nustep level 3 x5  min with PT present to discuss status 6 inch up and over curb no UE use Ladder walk with toe cone scoot 3 laps with CGA Sit to stand with blue power cord behind right knee 20x 10# KB dead lifts 10x 10# suitcase lifts 10x 2nd step knee flexion stretch 15x HS stretch on 2nd step 5x 15x 6 inch step up with no hand rails  Floor transfers with mat assist to rise (able to put pressure on left knee) Leg press seat 7 90# 20x good slow eccentric control,   right only 45# 10x  DATE: 03/21/2023 Nustep level 3 x5  min with PT present to discuss status 2nd step knee flexion stretch 15x HS stretch on 2nd step 5x 15x 6 inch step up with 2 hand rails  15x 6 inch step downs lopsided leg heel raises  Clock agility progressing to performing without UE support 8 inch step ups 5x Hurdles walk stepping over 4 laps Hurdles in box formation multiplane stepping Floor transfers with mat assist to rise (able to put pressure on left knee) Leg press seat 7 85# 20x good slow eccentric control,   right only 45# 10x  PATIENT EDUCATION:  Education details: Educated patient on anatomy and physiology of current symptoms, prognosis, plan of care as well as initial self care strategies to promote recovery Person educated: Patient Education method: Explanation Education comprehension: verbalized understanding  HOME EXERCISE PROGRAM: Access Code: [email or web address] URL: [email or web address] Date: 02/18/2023 Prepared  by: Clydie Braun Menke  Exercises - Seated Passive Knee Extension  - 1 x daily - 7 x weekly - 1 sets - 10 reps - Seated Long Arc Quad with Ankle Weight  - 1 x daily - 7 x weekly - 2 sets - 10 reps - Sit to Stand  - 1 x daily - 7 x weekly - 1 sets - 10 reps - Seated Quad Set  - 1 x daily - 7 x weekly - 2 sets - 10 reps  Discussed emphasis on achieving full knee extension ROM:  suggested every 2 hours help the knee get as straight as possible for 2-3 min using hands or a weight on thigh Will start a progressive HEP appropriate for her current level but has a HEP from previous facility Husband recorded videos of ex's on 7/19: LAQ 5#, seated hang with 5# on thigh, stairs stretching  ASSESSMENT:  CLINICAL IMPRESSION: Much improved gait speed with 3 MWT no cane needed and symmetrical stance time despite lack of terminal knee extension.  Treatment focus on full weight acceptance with little or no upper body support.  She has knee pain on arrival and some pain reported with step downs but overall pain is not a limiting factor for exercise.   Therapist monitoring response and providing cues and demonstration to optimize technique.     OBJECTIVE IMPAIRMENTS: decreased activity tolerance, decreased balance, decreased mobility, difficulty walking, decreased ROM, decreased strength, increased edema, impaired perceived functional ability, impaired flexibility, and pain.   ACTIVITY LIMITATIONS: lifting, bending, standing, squatting, sleeping, stairs, transfers, bed mobility, and locomotion level  PARTICIPATION LIMITATIONS: shopping, community activity, and yard work  PERSONAL FACTORS: Good general health positively affect patient's functional outcome.   REHAB POTENTIAL: Good  CLINICAL DECISION MAKING: Stable/uncomplicated  EVALUATION COMPLEXITY: Low  GOALS: Goals reviewed with patient? Yes  SHORT TERM GOALS: Target date:03/06/2023   The patient will demonstrate knowledge of basic self care  strategies and exercises to promote healing  Baseline: Goal status: met 7/26  2.  Right knee extension to 10 degrees needed for a normalized, more efficient gait pattern Baseline:  Goal status: ongoing  3.  Right knee flexion to 125 degrees for greater ease descending steps Baseline:  Goal status: met 7/23  4.  Improved quad strength to 4/5 needed for sit to stand transitions and in/out of the car with less pain Baseline:  Goal status: met 8/27 5.  5x sit to stand improved to 14.5 sec Baseline:  Goal status: MET on 02/18/2023   LONG TERM GOALS: Target date: 04/26/2023  The patient will be independent in a safe self progression of a home exercise program to promote further recovery of function and relieve pain Baseline:  Goal status: ongoing  2.  Right knee flexion 130 degrees needed for stooping for taking care of her dog and ease with descending curbs/steps Baseline:  Goal status: met 9/3  3.  Right knee extension to 6 degrees needed for balance/agility and gait safety for longer distance walking Baseline:  Goal status: ongoing  4.  Right LE strength grossly 4+/5 needed for full weight acceptance when negotiating curbs, steps and with sit to stand transitions Baseline:  Goal status: ongoing  5.  The patient will be able to get up off the floor/ground for gardening Baseline:  Goal status: met 9/3   PLAN:  PT FREQUENCY:1- 2x/week  PT DURATION: 4 weeks  PLANNED INTERVENTIONS: Therapeutic exercises, Therapeutic activity, Neuromuscular re-education, Balance training, Gait training, Patient/Family education, Self Care, Joint mobilization, Aquatic Therapy, Electrical stimulation, Cryotherapy, Moist heat, Taping, Vasopneumatic device, Manual therapy, and Re-evaluation  PLAN FOR NEXT SESSION:probable discharge next visit;  bil leg press to 100#;   try to get appt for 9/17 or 9/19;  floor transfer work up to getting up without pulling up from mat;   leg press;  Nu-Step for ROM  for knee extension;  emphasize quad strengthening; progressive proprioceptive ex's for full WB on right   Lavinia Sharps, PT 04/04/23 12:18 PM Phone: [phone] Fax: [phone]   [facility] [address] [place], [place] [address] Phone # [phone] Fax [phone]

## 2023-04-08 ENCOUNTER — Ambulatory Visit: Payer: Self-pay | Admitting: Physical Therapy

## 2023-04-08 DIAGNOSIS — M25661 Stiffness of right knee, not elsewhere classified: Secondary | ICD-10-CM

## 2023-04-08 DIAGNOSIS — M25561 Pain in right knee: Secondary | ICD-10-CM | POA: Diagnosis not present

## 2023-04-08 DIAGNOSIS — R262 Difficulty in walking, not elsewhere classified: Secondary | ICD-10-CM

## 2023-07-23 ENCOUNTER — Encounter: Payer: Self-pay | Admitting: Family Medicine

## 2023-07-23 DIAGNOSIS — Z1231 Encounter for screening mammogram for malignant neoplasm of breast: Secondary | ICD-10-CM

## 2023-07-30 ENCOUNTER — Other Ambulatory Visit: Payer: Self-pay | Admitting: Family Medicine

## 2023-07-30 DIAGNOSIS — Z1231 Encounter for screening mammogram for malignant neoplasm of breast: Secondary | ICD-10-CM

## 2023-08-02 ENCOUNTER — Ambulatory Visit
Admission: RE | Admit: 2023-08-02 | Discharge: 2023-08-02 | Disposition: A | Discharge status: Home / Self Care | Payer: Medicare Other | Source: Ambulatory Visit | Attending: Family Medicine | Admitting: Family Medicine

## 2023-08-02 DIAGNOSIS — Z1231 Encounter for screening mammogram for malignant neoplasm of breast: Secondary | ICD-10-CM

## 2024-01-22 ENCOUNTER — Encounter: Payer: Self-pay | Admitting: Physical Therapy

## 2024-01-22 ENCOUNTER — Ambulatory Visit: Attending: Sports Medicine | Admitting: Physical Therapy

## 2024-01-22 ENCOUNTER — Other Ambulatory Visit: Payer: Self-pay

## 2024-01-22 DIAGNOSIS — M25561 Pain in right knee: Secondary | ICD-10-CM | POA: Diagnosis present

## 2024-01-22 DIAGNOSIS — G8929 Other chronic pain: Secondary | ICD-10-CM | POA: Diagnosis present

## 2024-01-22 DIAGNOSIS — R2689 Other abnormalities of gait and mobility: Secondary | ICD-10-CM | POA: Insufficient documentation

## 2024-01-22 DIAGNOSIS — M6281 Muscle weakness (generalized): Secondary | ICD-10-CM | POA: Insufficient documentation

## 2024-01-22 NOTE — Therapy (Signed)
 OUTPATIENT PHYSICAL THERAPY LOWER EXTREMITY EVALUATION   Patient Name: Misty Dodson MRN: [id number] DOB:1935-11-03, 88 y.o., female Today's Date: 01/22/2024  END OF SESSION:  PT End of Session - 01/22/24 1015     Visit Number 1    Date for PT Re-Evaluation 03/18/24    Authorization Type [employer] Medicare    Progress Note Due on Visit 10    PT Start Time 1015    PT Stop Time 1100    PT Time Calculation (min) 45 min    Activity Tolerance Patient tolerated treatment well          Past Medical History:  Diagnosis Date   Anxiety    occasional panic attacks   Family history of anesthesia complication    son's heart stopped during knee repair age 20Yrs   GERD (gastroesophageal reflux disease)    Heart murmur    Hypertension    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   Past Surgical History:  Procedure Laterality Date   ABDOMINAL HYSTERECTOMY     ANTERIOR AND POSTERIOR REPAIR N/A 02/23/2014   Procedure: ANTERIOR (CYSTOCELE) AND POSTERIOR REPAIR (RECTOCELE);  Surgeon: Ezzie Buba, MD;  Location: [facility];  Service: Gynecology;  Laterality: N/A;   BACK SURGERY     several times   CYSTOSCOPY N/A 02/23/2014   Procedure: CYSTOSCOPY;  Surgeon: Glendia DELENA Elizabeth, MD;  Location: [facility];  Service: Urology;  Laterality: N/A;   FOOT SURGERY     PUBOVAGINAL SLING N/A 02/23/2014   Procedure: CARLOYN GLADE;  Surgeon: Glendia DELENA Elizabeth, MD;  Location: [facility];  Service: Urology;  Laterality: N/A;   TONSILLECTOMY     Patient Active Problem List   Diagnosis Date Noted   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  I still do my exercises every day. I have pain at night every night.  Now the left knee has started to hurt.  Some near misses with falls but no real falls.  I walk the dog with no problem.  Difficulty from getting up from the kneeling position at church.  Going to [employer] later in July  PERTINENT HISTORY:  right TKR  11/06/22 with rehab at [facility] HTN; GERD Right shoulder replacement Hard of hearing wears hearing aide PAIN:  Are you having pain? Yes NPRS scale: 3-4/10 Pain location: medial and lateral right knee Pain orientation: Right and Left  PAIN TYPE: throbbing Pain description: intermittent  Aggravating factors: night time, lying with knee bent Relieving factors: walking OK   PRECAUTIONS: Fall   WEIGHT BEARING RESTRICTIONS: No  FALLS:  Has patient fallen in last 6 months? No  LIVING ENVIRONMENT: Lives with: lives with their spouse Lives in: House/apartment Stairs: main living and bedroom downstairs; husband uses the space upstairs more than she does    OCCUPATION: retired  PLOF: Independent  PATIENT GOALS: be able to run across the fairway (fall forward); I haven't done stairs in a long time   OBJECTIVE:  Note: Objective measures were completed at Evaluation unless otherwise noted.  DIAGNOSTIC FINDINGS: none at  the 1 year TKR mark per pt report  PATIENT SURVEYS:  LEFS  Extreme difficulty/unable (0), Quite a bit of difficulty (1), Moderate difficulty (2), Little difficulty (3), No difficulty (4) Survey date:     7/2  Any of your usual work, housework or school activities   2. Usual hobbies, recreational or sporting activities   3. Getting into/out of the bath   4. Walking between rooms   5. Putting on socks/shoes   6. Squatting    7. Lifting an object, like a bag of groceries from the floor   8. Performing light activities around your home   9. Performing heavy activities around  your home   10. Getting into/out of a car   11. Walking 2 blocks   12. Walking 1 mile   13. Going up/down 10 stairs (1 flight)   14. Standing for 1 hour   15.  sitting for 1 hour   16. Running on even ground   17. Running on uneven ground   18. Making sharp turns while running fast   19. Hopping    20. Rolling over in bed   Score total:  70/80     COGNITION: Overall cognitive status: Within functional limits for tasks assessed     MUSCLE LENGTH: Good hamstring length bil  LOWER EXTREMITY ROM: Right: Seated knee extension lacks 8 degrees Right: Supine knee ROM 5-137 degrees  LOWER EXTREMITY MMT: Right quads 4/5:  with 6 inch step down test = genu valgus/hip adduction Right glutes 4/5   FUNCTIONAL TESTS:  Able to rise sit to stand without UE assist but with knees adducted/internally rotated (touching) To pick up a small object from the floor she uses mostly a hip hinge strategy and less knee flexion SLS: left 3 sec, right 0 sec with pelvic drop  GAIT:  Good gait speed; no assistive device needed   MINI-BESTest: Balance Evaluation Systems Test ANTICIPATORY:   SIT TO STAND: (2) normal without use of hands and stabilizes independently     (1) Moderate comes to stand WITH use of hands on 1st attempt    (0) Severe: unable to stand up from chair without assistance OR needs several attempts with use of   hands Score: 2  2. RISE TO TOES: feet shoulder width apart. Hands on hips.  Rise as high as you can onto your toes. Try to hold this pose for 3 sec                          (2) stable for 3s with maximum height    (1) heels up, but not full range (smaller than when holding hands) OR noticeable instability for 3 sec                           (0) < or equat to 3 s  Score: 2  3. STAND ON ONE LEG: look straight ahead. Hands on hips. Lift your leg off the ground.     Left:  trial 1 __0__ trial 2____                                    Right: Trial 1:__0___   Trial  2:_______   (2) 20 s       (2)  20 s                (  1) < 20      (1) < 20 s   (0) Unable      (0) unable Score (use the worst side):  0  REACTIVE POSTURAL CONTROL 4. COMPENSATORY STEPPING CORRECTION FORWARD stand with feet shoulder width apart, arms at your sides. Lean forward against my hands beyond your forward limits.  When I let go, do whatever is necessary, including taking a step, to avoid a fall   (2) recovers independently  with a single, large step, to avoid a fall   (1) more than one step used to recover equilibrium   (0) No step OR would fall if not caught Score: 0  5. COMPENSATORY STEPPING CORRECTION BACKWARD: Lean backward against my hands beyond your backward limits.  When I let go, do whatever is necessary, including a step to avoid a fall. (2) recovers independently  with a single, large step, to avoid a fall   (1) more than one step used to recover equilibrium   (0) No step OR would fall if not caught Score: 0  6. COMPENSATORY STEPPING LATERAL: Lean into my hand beyond your sideways limit.  When I let go, do whatever is necessary, including taking a step, to avoid a fall. Left           Right   (2) recovers independently with 1 step (crossover or lateral OK)   (1) several steps to recover equilibrium   (0) falls or cannot step Score:   2  (use the side with the lowest score)  SENSORY ORIENTATION 7. STANCE FEET TOGETHER EYES OPEN  firm surface Be as stable and still as possible until I say stop   (2) 30s   (1) < 30 s   (0) unable Score: 2  8. STANCE ON FOAM EYES CLOSED feet together, eyes closed, hands on hips. Be as stable and still as you can until I stay stop.  I will start timing when you close your eyes. Time in seconds:      (2) 30s   (1) <30 s   (0) unable Score: 0  9. INCLINE EYES CLOSED: Stand on incline with feet shoulder width apart, arms down at your sides.  I will start timing when you close your eyes   (2) Stands 30 s and aligns with  gravity   (1) stands < 30 s OR aligns with surface   (0) unable Score: 1  DYNAMIC GAIT 10. CHANGE IN GAIT SPEED: begin walking at your normal speed, when I tell you fast, walk as fast as you can, when I say slow walk very slowly   (2) significantly changes walking speed without imbalance   (1) unable to change walking speed or signs of imbalance   (0) unable to achieve significant change in walking speed AND signes of imbalance Score:  2  11. WALK WITH HEAD TURNS HORIZONTAL: begin walking at normal speed, when I say right, turn your head to the right, when I say left turn your head to the left.  Try to keep yourself walking in a straight line   (2) performs head turns with no change in gait speed and good balance   (1) performs head turns with reduction in gait speed   (0)  performs head turns with imbalance Score: 2  12. WALK WITH PIVOT TURNS: begin walking at your normal speed.  When I tell you to turn and stop, turn as quickly as you can, face the opposite direction and stop.  After  the turn, your feet should be close together   (2) turns with feet close FAST in 3 steps with good balance   (1) turns with feet close SLOW > 4 steps with good balance   (0) cannot turn with feet close at any speed without imbalance Score: 2  13. STEP OVER OBSTACLES:  ( 9 inch height 10 feet away from subject) begin walking at your normal speed.  When you get to the box, step over it, not around it and keep walking   (2) Able to step over box with minimal change of gait speed and good balance   (1) Steps over box but touches box OR displays cautious behavior by slowing gait   (0) Unable to step over box OR steps around box Score: 2  14. TIMED UP AND GO NORMAL AND WITH DUAL TASK: 3 METER WALK: When I say go walk at your your normal speed across the tape, turn around and come back to sit in the chair   Time: Count backwards by 3s starting at    20.  When I say go, stand up from the chair, walk at  your normal speed across the tape on the floor, turn around, come back to sit in the chair.  Continue counting backwards the entire time.  Time:   (2) No noticeable change in sitting, standing or walking while backward counting when compared to TUG without dual task   (1) Dual task affects either counting OR walking (>10%)    (0) stops counting while walking OR stops walking while counting If gait speed slows more than 10% between TUG without and with dual task, the score should be decreased by a point Score:  not performed on eval  Total score:        17   /26 (# 14 not performed)                                                                                                                                 TREATMENT DATE: 01/22/24 evaluation  Plan of care: quad strengthening, balance (single leg, sensory orientation and reactive postural control balance) Discussed practicing SLS next to kitchen counter Discussed eyes closed practice in the corner (back to the corner)   PATIENT EDUCATION:  Education details: Educated patient on anatomy and physiology of current symptoms, prognosis, plan of care as well as initial self care strategies to promote recovery Person educated: Patient Education method: Explanation Education comprehension: verbalized understanding  HOME EXERCISE PROGRAM: To be started  ASSESSMENT:  CLINICAL IMPRESSION: Patient is a 88 y.o. female who was seen today for physical therapy evaluation and treatment for right knee pain and balance impairment > 1 year s/p TKR.  [name]'s ROM is much improved, particularly with extension, since she finished PT in September. Current right knee ROM 5-137 degrees.   Strength is good overall although weakness in right glutes and quads affect her ability to  rise from a chair without compensations (hip adduction/knee internal rotation) and rise from a kneeling position at church. Mini-BEST test indicates impairments noted with eyes closed,  single leg standing and reactive postural control balance and therefore at risk for falls.  Her pain is mild at the moment and mostly at night time.  She would benefit from PT to address these deficits needed for community mobility, upcoming travel and to decrease risk of falls.      OBJECTIVE IMPAIRMENTS: decreased activity tolerance, decreased balance, decreased mobility, difficulty walking, decreased ROM, decreased strength, increased edema, impaired perceived functional ability, impaired flexibility, and pain.   ACTIVITY LIMITATIONS: lifting, bending, standing, squatting, sleeping, stairs, transfers, bed mobility, and locomotion level   PARTICIPATION LIMITATIONS: shopping, community activity, and yard work   PERSONAL FACTORS: Good general health positively affect patient's functional outcome.    REHAB POTENTIAL: Good   CLINICAL DECISION MAKING: Stable/uncomplicated   EVALUATION COMPLEXITY: Low GOALS: Goals reviewed with patient? Yes  SHORT TERM GOALS: Target date: 02/19/2024   The patient will demonstrate knowledge of basic self care strategies and exercises to promote healing  Baseline: Goal status: INITIAL   2.  Able to single leg balance for 3-5 sec on right needed for ascending/descending curbs while traveling to [employer]  Baseline:  Goal status: INITIAL   3.  Patient will be able to rise from a standard height chair with knees in alignment/ without compensatory strategies in order to decrease pain in both knees Baseline:  Goal status: INITIAL    LONG TERM GOALS: Target date: 03/18/2024   The patient will be independent in a safe self progression of a home exercise program to promote further recovery of function and relieve pain Baseline:  Goal status: INITIAL   2.  Patient will report a 50% improvement in rising from a kneeling position at church Baseline:  Goal status: INITIAL   3.  Improved balance with Mini-BEST balance score improved to at least 20 indicating  a decreased risk of falls  Baseline:  Goal status: INITIAL   4.  Improved strength needed for ascending/descending steps while traveling  Baseline:  Goal status: INITIAL   5.  LE strength needed to meet her personal goal of running across the fairway  Baseline:  Goal status: INITIAL   PLAN:  PT FREQUENCY: 2x/week  PT DURATION: 8 weeks  PLANNED INTERVENTIONS: 97164- PT Re-evaluation, 97110-Therapeutic exercises, 97530- Therapeutic activity, 97112- Neuromuscular re-education, 97535- Self Care, [id number]- Manual therapy, [phone]- Aquatic Therapy, [id number]- Electrical stimulation (unattended), [phone]- Electrical stimulation (manual), 97016- Vasopneumatic device, Patient/Family education, Balance training, Stair training, Taping, Joint mobilization, Cryotherapy, and Moist heat  PLAN FOR NEXT SESSION: single leg balance; balance with perturbances; balance with eyes closed; 6 inch step downs for strength (pt not a fan of the leg press); standing at the sink with backwards mini lunge   Glade Pesa, PT 01/22/24 7:20 PM Phone: [phone] Fax: [phone]

## 2024-02-03 ENCOUNTER — Encounter: Payer: Self-pay | Admitting: Physical Therapy

## 2024-02-03 ENCOUNTER — Ambulatory Visit: Admitting: Physical Therapy

## 2024-02-03 DIAGNOSIS — G8929 Other chronic pain: Secondary | ICD-10-CM

## 2024-02-03 DIAGNOSIS — R2689 Other abnormalities of gait and mobility: Secondary | ICD-10-CM

## 2024-02-03 DIAGNOSIS — M6281 Muscle weakness (generalized): Secondary | ICD-10-CM

## 2024-02-03 NOTE — Therapy (Signed)
 OUTPATIENT PHYSICAL THERAPY LOWER EXTREMITY TREATMENT NOTE   Patient Name: Misty Dodson MRN: [id number] DOB:[birth year], 88 y.o., female Today's Date: 02/03/2024  END OF SESSION:  PT End of Session - 02/03/24 0801     Visit Number 2    Date for PT Re-Evaluation 03/18/24    Authorization Type BCBS Medicare    Progress Note Due on Visit 10    PT Start Time 0800    PT Stop Time 0850    PT Time Calculation (min) 50 min    Activity Tolerance Patient tolerated treatment well    Behavior During Therapy [facility] for tasks assessed/performed           Past Medical History:  Diagnosis Date   Anxiety    occasional panic attacks   Family history of anesthesia complication    son's heart stopped during knee repair age 39Yrs   GERD (gastroesophageal reflux disease)    Heart murmur    Hypertension    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   Past Surgical History:  Procedure Laterality Date   ABDOMINAL HYSTERECTOMY     ANTERIOR AND POSTERIOR REPAIR N/A 02/23/2014   Procedure: ANTERIOR (CYSTOCELE) AND POSTERIOR REPAIR (RECTOCELE);  Surgeon: Ezzie Buba, MD;  Location: [facility];  Service: Gynecology;  Laterality: N/A;   BACK SURGERY     several times   CYSTOSCOPY N/A 02/23/2014   Procedure: CYSTOSCOPY;  Surgeon: Glendia DELENA Elizabeth, MD;  Location: [facility];  Service: Urology;  Laterality: N/A;   FOOT SURGERY     PUBOVAGINAL SLING N/A 02/23/2014   Procedure: CARLOYN GLADE;  Surgeon: Glendia DELENA Elizabeth, MD;  Location: [facility];  Service: Urology;  Laterality: N/A;   TONSILLECTOMY     Patient Active Problem List   Diagnosis Date Noted   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  I am fine walking.  I have knee pain at night mostly.  My balance isn't good.   Eval: I think this (right) leg is longer and I was unsteady in April when I went to my 1 year ortho follow up from TKR.  I still do my exercises every day. I have pain at night every night.  Now the left knee has started to hurt.  Some near misses with falls but no real falls.  I walk the dog with no problem.  Difficulty from getting up from the kneeling position at church.  Going to [employer] later in July  PERTINENT HISTORY:  right TKR  11/06/22 with rehab at [facility] HTN; GERD Right shoulder replacement Hard of hearing wears hearing aide PAIN:  Are you having pain? Yes NPRS scale: 0/10 Pain location: medial and lateral right knee Pain orientation: Right and Left  PAIN TYPE: throbbing Pain description: intermittent  Aggravating factors: night time, lying with knee bent Relieving factors: walking OK   PRECAUTIONS: Fall   WEIGHT BEARING RESTRICTIONS: No  FALLS:  Has patient fallen in last 6 months? No  LIVING ENVIRONMENT: Lives with: lives with their spouse Lives in: House/apartment Stairs: main living and bedroom downstairs; husband uses the space upstairs more than she does    OCCUPATION: retired  PLOF: Independent  PATIENT GOALS: be able to run across the fairway (fall forward); I haven't done stairs in a long time   OBJECTIVE:  Note: Objective measures were completed at Evaluation unless otherwise noted.  DIAGNOSTIC FINDINGS: none at the 1 year TKR mark per pt report  PATIENT SURVEYS:  LEFS  Extreme difficulty/unable (0), Quite a bit of difficulty (1), Moderate difficulty (2), Little difficulty (3), No difficulty (4) Survey date:     7/2  Any of your usual work, housework or school activities   2. Usual hobbies, recreational or sporting activities   3. Getting into/out of the bath   4. Walking between rooms   5.  Putting on socks/shoes   6. Squatting    7. Lifting an object, like a bag of groceries from the floor   8. Performing light activities around your home   9. Performing heavy activities around your home   10. Getting into/out of a car   11. Walking 2 blocks   12. Walking 1 mile   13. Going up/down 10 stairs (1 flight)   14. Standing for 1 hour   15.  sitting for 1 hour   16. Running on even ground   17. Running on uneven ground   18. Making sharp turns while running fast   19. Hopping    20. Rolling over in bed   Score total:  70/80     COGNITION: Overall cognitive status: Within functional limits for tasks assessed     MUSCLE LENGTH: Good hamstring length bil  LOWER EXTREMITY ROM: Right: Seated knee extension lacks 8 degrees Right: Supine knee ROM 5-137 degrees  LOWER EXTREMITY MMT: Right quads 4/5:  with 6 inch step down test = genu valgus/hip adduction Right glutes 4/5   FUNCTIONAL TESTS:  Able to rise sit to stand without UE assist but with knees adducted/internally rotated (touching) To pick up a small object from the floor she uses mostly a hip hinge strategy and less knee flexion SLS: left 3 sec, right 0 sec with pelvic drop  GAIT:  Good gait speed; no assistive device needed   MINI-BESTest: Balance Evaluation Systems Test ANTICIPATORY:   SIT TO STAND: (2) normal without use of hands and stabilizes independently     (1) Moderate comes to stand WITH use of hands on 1st attempt    (0) Severe: unable to stand up from chair without assistance OR needs several attempts with use of   hands Score: 2  2. RISE TO TOES: feet shoulder width apart. Hands on hips.  Rise as high as you can onto your toes. Try to hold this pose for 3 sec                          (2) stable for 3s with maximum height    (1) heels up, but not full range (smaller than when holding hands) OR noticeable instability for 3 sec                           (0) < or equat to 3 s  Score: 2  3. STAND  ON ONE LEG: look straight ahead. Hands on hips. Lift your leg off the ground.     Left:  trial 1 __0__ trial 2____  Right: Trial 1:__0___   Trial 2:_______   (2) 20 s       (2)  20 s                (1) < 20      (1) < 20 s   (0) Unable      (0) unable Score (use the worst side):  0  REACTIVE POSTURAL CONTROL 4. COMPENSATORY STEPPING CORRECTION FORWARD stand with feet shoulder width apart, arms at your sides. Lean forward against my hands beyond your forward limits.  When I let go, do whatever is necessary, including taking a step, to avoid a fall   (2) recovers independently  with a single, large step, to avoid a fall   (1) more than one step used to recover equilibrium   (0) No step OR would fall if not caught Score: 0  5. COMPENSATORY STEPPING CORRECTION BACKWARD: Lean backward against my hands beyond your backward limits.  When I let go, do whatever is necessary, including a step to avoid a fall. (2) recovers independently  with a single, large step, to avoid a fall   (1) more than one step used to recover equilibrium   (0) No step OR would fall if not caught Score: 0  6. COMPENSATORY STEPPING LATERAL: Lean into my hand beyond your sideways limit.  When I let go, do whatever is necessary, including taking a step, to avoid a fall. Left           Right   (2) recovers independently with 1 step (crossover or lateral OK)   (1) several steps to recover equilibrium   (0) falls or cannot step Score:   2  (use the side with the lowest score)  SENSORY ORIENTATION 7. STANCE FEET TOGETHER EYES OPEN  firm surface Be as stable and still as possible until I say stop   (2) 30s   (1) < 30 s   (0) unable Score: 2  8. STANCE ON FOAM EYES CLOSED feet together, eyes closed, hands on hips. Be as stable and still as you can until I stay stop.  I will start timing when you close your eyes. Time in seconds:      (2) 30s   (1) <30 s   (0) unable Score: 0  9.  INCLINE EYES CLOSED: Stand on incline with feet shoulder width apart, arms down at your sides.  I will start timing when you close your eyes   (2) Stands 30 s and aligns with gravity   (1) stands < 30 s OR aligns with surface   (0) unable Score: 1  DYNAMIC GAIT 10. CHANGE IN GAIT SPEED: begin walking at your normal speed, when I tell you fast, walk as fast as you can, when I say slow walk very slowly   (2) significantly changes walking speed without imbalance   (1) unable to change walking speed or signs of imbalance   (0) unable to achieve significant change in walking speed AND signes of imbalance Score:  2  11. WALK WITH HEAD TURNS HORIZONTAL: begin walking at normal speed, when I say right, turn your head to the right, when I say left turn your head to the left.  Try to keep yourself walking in a straight line   (2) performs head turns with no change in gait speed and good balance   (1) performs head turns with reduction in gait speed   (0)  performs head turns with imbalance  Score: 2  12. WALK WITH PIVOT TURNS: begin walking at your normal speed.  When I tell you to turn and stop, turn as quickly as you can, face the opposite direction and stop.  After the turn, your feet should be close together   (2) turns with feet close FAST in 3 steps with good balance   (1) turns with feet close SLOW > 4 steps with good balance   (0) cannot turn with feet close at any speed without imbalance Score: 2  13. STEP OVER OBSTACLES:  ( 9 inch height 10 feet away from subject) begin walking at your normal speed.  When you get to the box, step over it, not around it and keep walking   (2) Able to step over box with minimal change of gait speed and good balance   (1) Steps over box but touches box OR displays cautious behavior by slowing gait   (0) Unable to step over box OR steps around box Score: 2  14. TIMED UP AND GO NORMAL AND WITH DUAL TASK: 3 METER WALK: When I say go walk at your your  normal speed across the tape, turn around and come back to sit in the chair   Time: Count backwards by 3s starting at    20.  When I say go, stand up from the chair, walk at your normal speed across the tape on the floor, turn around, come back to sit in the chair.  Continue counting backwards the entire time.  Time:   (2) No noticeable change in sitting, standing or walking while backward counting when compared to TUG without dual task   (1) Dual task affects either counting OR walking (>10%)    (0) stops counting while walking OR stops walking while counting If gait speed slows more than 10% between TUG without and with dual task, the score should be decreased by a point Score:  not performed on eval  Total score:        17   /26 (# 14 not performed)                                                                                                                                 TREATMENT DATE:  02/03/24 Recumbent bike L3 x 4' PT present to discuss status Seated HS stretch on Rt with application of pressure to distal thigh for knee extension stretch 2x30 Sit to stand from chair with mirror for biofeedback for knee valgus control, slow motion eccentric stand to sit with VC for eyes on knees Seated clam with yellow loop band x20 then more reps of sit to stand with improved control of knees bil At counter: blue loop x10 each Rt hip abd and ext, then no loop plank on elbows on counter with Rt LE donkey kick to ceiling for glut isolation with PT tapping glut max TKE with thin red power cord and 2 lateral  step up holding on single vertical upright x15 for quad and hip control Deadlift holding 5lb x8 Bwd resistance walking x5 with 10lb for posterior chain strength and righting mechanism to prevent forward fall, eccentric quads on walk back in Standing with back to corner eyes closed feet together 2x20 Standing in stagger stance back to corner with trunk perturbations by PT in both foot  positions Standing in square stance with trunk perturbations by PT Single LE stance on Rt starting  Seated fig 4 Rt x1 while discussing HEP during [place] HEP - keep it simple for now b/c Pt traveling - gave yellow tied band for standing counter hip abd and ext, Pt will keep working on back to corner balance with eyes closed and SLS at counter with min single hand/fingers to maintain control   01/22/24 evaluation  Plan of care: quad strengthening, balance (single leg, sensory orientation and reactive postural control balance) Discussed practicing SLS next to kitchen counter Discussed eyes closed practice in the corner (back to the corner)   PATIENT EDUCATION:  Education details: Educated patient on anatomy and physiology of current symptoms, prognosis, plan of care as well as initial self care strategies to promote recovery Person educated: Patient Education method: Explanation Education comprehension: verbalized understanding  HOME EXERCISE PROGRAM: Access Code: [email or web address] URL: [email or web address] Date: 02/03/2024 Prepared by: Orvil Janei Scheff  Exercises - Standing Hip Abduction with Resistance at Ankles and Counter Support  - 2 x daily - 7 x weekly - 1 sets - 10 reps - Standing Hip Extension with Resistance at Ankles and Counter Support  - 2 x daily - 7 x weekly - 1 sets - 10 reps  ASSESSMENT:  CLINICAL IMPRESSION: [name] made good progress within session today using biofeedback with mirror and VC/TC for Rt LE control, alignment, and muscle recruitment with functional tasks such as sit to stand/stand to sit and closed chain knee and hip strengthening.  We also worked on balance in SLS, narrow stance with eyes closed, and manual trunk perturbations by PT.  Pt about to travel to [place] so she wanted to keep her HEP simple.  She will work on SLS at [employer] and do light open chain banded hip strength for ext and abd while traveling.   Eval: Patient is a 88 y.o. female  who was seen today for physical therapy evaluation and treatment for right knee pain and balance impairment > 1 year s/p TKR.  [name]'s ROM is much improved, particularly with extension, since she finished PT in September. Current right knee ROM 5-137 degrees.   Strength is good overall although weakness in right glutes and quads affect her ability to rise from a chair without compensations (hip adduction/knee internal rotation) and rise from a kneeling position at church. Mini-BEST test indicates impairments noted with eyes closed, single leg standing and reactive postural control balance and therefore at risk for falls.  Her pain is mild at the moment and mostly at night time.  She would benefit from PT to address these deficits needed for community mobility, upcoming travel and to decrease risk of falls.      OBJECTIVE IMPAIRMENTS: decreased activity tolerance, decreased balance, decreased mobility, difficulty walking, decreased ROM, decreased strength, increased edema, impaired perceived functional ability, impaired flexibility, and pain.   ACTIVITY LIMITATIONS: lifting, bending, standing, squatting, sleeping, stairs, transfers, bed mobility, and locomotion level   PARTICIPATION LIMITATIONS: shopping, community activity, and yard work   PERSONAL FACTORS: Good general health positively affect patient's functional outcome.  REHAB POTENTIAL: Good   CLINICAL DECISION MAKING: Stable/uncomplicated   EVALUATION COMPLEXITY: Low GOALS: Goals reviewed with patient? Yes  SHORT TERM GOALS: Target date: 02/19/2024   The patient will demonstrate knowledge of basic self care strategies and exercises to promote healing  Baseline: Goal status: INITIAL   2.  Able to single leg balance for 3-5 sec on right needed for ascending/descending curbs while traveling to [employer]  Baseline:  Goal status: INITIAL   3.  Patient will be able to rise from a standard height chair with knees in alignment/ without  compensatory strategies in order to decrease pain in both knees Baseline:  Goal status: INITIAL    LONG TERM GOALS: Target date: 03/18/2024   The patient will be independent in a safe self progression of a home exercise program to promote further recovery of function and relieve pain Baseline:  Goal status: INITIAL   2.  Patient will report a 50% improvement in rising from a kneeling position at church Baseline:  Goal status: INITIAL   3.  Improved balance with Mini-BEST balance score improved to at least 20 indicating a decreased risk of falls  Baseline:  Goal status: INITIAL   4.  Improved strength needed for ascending/descending steps while traveling  Baseline:  Goal status: INITIAL   5.  LE strength needed to meet her personal goal of running across the fairway  Baseline:  Goal status: INITIAL   PLAN:  PT FREQUENCY: 2x/week  PT DURATION: 8 weeks  PLANNED INTERVENTIONS: 97164- PT Re-evaluation, 97110-Therapeutic exercises, 97530- Therapeutic activity, 97112- Neuromuscular re-education, 97535- Self Care, [id number]- Manual therapy, [phone]- Aquatic Therapy, [id number]- Electrical stimulation (unattended), [phone]- Electrical stimulation (manual), 97016- Vasopneumatic device, Patient/Family education, Balance training, Stair training, Taping, Joint mobilization, Cryotherapy, and Moist heat  PLAN FOR NEXT SESSION: single leg balance; balance with perturbances; balance with eyes closed; 6 inch step downs for strength (pt not a fan of the leg press); standing at the sink with backwards mini lunge   Johnross Nabozny, PT 02/03/24 11:55 AM  Phone: [phone] Fax: [phone]

## 2024-02-04 ENCOUNTER — Emergency Department (HOSPITAL_COMMUNITY)
Admission: EM | Admit: 2024-02-04 | Discharge: 2024-02-04 | Disposition: A | Discharge status: Home / Self Care | Attending: Student | Admitting: Student

## 2024-02-04 ENCOUNTER — Encounter (HOSPITAL_COMMUNITY): Payer: Self-pay

## 2024-02-04 ENCOUNTER — Emergency Department (HOSPITAL_COMMUNITY)

## 2024-02-04 DIAGNOSIS — M25562 Pain in left knee: Secondary | ICD-10-CM | POA: Diagnosis present

## 2024-02-04 DIAGNOSIS — Z87891 Personal history of nicotine dependence: Secondary | ICD-10-CM | POA: Diagnosis not present

## 2024-02-04 DIAGNOSIS — I10 Essential (primary) hypertension: Secondary | ICD-10-CM | POA: Diagnosis not present

## 2024-02-04 DIAGNOSIS — S82035A Nondisplaced transverse fracture of left patella, initial encounter for closed fracture: Secondary | ICD-10-CM | POA: Diagnosis not present

## 2024-02-04 DIAGNOSIS — W010XXA Fall on same level from slipping, tripping and stumbling without subsequent striking against object, initial encounter: Secondary | ICD-10-CM | POA: Insufficient documentation

## 2024-02-04 DIAGNOSIS — Y9301 Activity, walking, marching and hiking: Secondary | ICD-10-CM | POA: Diagnosis not present

## 2024-02-04 MED ORDER — MORPHINE SULFATE (PF) 4 MG/ML IV SOLN: 4.0000 mg | Freq: Once | INTRAVENOUS | Status: DC

## 2024-02-04 MED ORDER — OXYCODONE HCL 5 MG PO TABS: 5.0000 mg | ORAL_TABLET | Freq: Four times a day (QID) | ORAL | 0 refills | Status: AC | PRN

## 2024-02-04 MED ORDER — NAPROXEN 375 MG PO TABS: 375.0000 mg | ORAL_TABLET | Freq: Two times a day (BID) | ORAL | 0 refills | Status: DC

## 2024-02-04 MED ORDER — ACETAMINOPHEN 500 MG PO TABS
1000.0000 mg | ORAL_TABLET | Freq: Three times a day (TID) | ORAL | 0 refills | Status: AC
Start: 2024-02-04 — End: 2024-03-05

## 2024-02-04 MED ORDER — NAPROXEN 375 MG PO TABS: 375.0000 mg | ORAL_TABLET | Freq: Two times a day (BID) | ORAL | 0 refills | Status: AC

## 2024-02-04 MED ORDER — MORPHINE SULFATE (PF) 4 MG/ML IV SOLN
4.0000 mg | Freq: Once | INTRAVENOUS | Status: AC
  Administered 2024-02-04: 4 mg via INTRAMUSCULAR
  Filled 2024-02-04: qty 1

## 2024-02-04 MED ORDER — OXYCODONE HCL 5 MG PO TABS: 5.0000 mg | ORAL_TABLET | Freq: Four times a day (QID) | ORAL | 0 refills | Status: DC | PRN

## 2024-02-04 MED ORDER — HYDROCODONE-ACETAMINOPHEN 5-325 MG PO TABS
1.0000 | ORAL_TABLET | Freq: Once | ORAL | Status: AC
  Administered 2024-02-04: 1 via ORAL
  Filled 2024-02-04: qty 1

## 2024-02-04 MED ORDER — ONDANSETRON 4 MG PO TBDP
4.0000 mg | ORAL_TABLET | Freq: Once | ORAL | Status: AC
  Administered 2024-02-04: 4 mg via ORAL
  Filled 2024-02-04: qty 1

## 2024-02-04 MED ORDER — NAPROXEN 500 MG PO TABS
500.0000 mg | ORAL_TABLET | Freq: Once | ORAL | Status: AC
  Administered 2024-02-04: 500 mg via ORAL
  Filled 2024-02-04: qty 1

## 2024-02-04 MED ORDER — ACETAMINOPHEN 500 MG PO TABS: 1000.0000 mg | ORAL_TABLET | Freq: Three times a day (TID) | ORAL | 0 refills | Status: DC

## 2024-02-04 NOTE — Discharge Instructions (Addendum)
 For pain:  - Acetaminophen 1000 mg three times daily (every 8 hours) - Naproxen 2 times daily (every 12 hours) - oxycodone for breakthrough pain only

## 2024-02-04 NOTE — Progress Notes (Signed)
 Orthopedic Tech Progress Note Patient Details:  EBUNOLUWA GERNERT 1936/02/28 [id number]  Ortho Devices Type of Ortho Device: Knee Immobilizer Ortho Device/Splint Location: left knee immobilizer applied Ortho Device/Splint Interventions: Ordered, Application, Adjustment   Post Interventions Patient Tolerated: Well, Ambulated well Instructions Provided: Care of device, Adjustment of device  Waylan Thom Loving 02/04/2024, 1:24 PM

## 2024-02-04 NOTE — ED Provider Notes (Signed)
  EMERGENCY DEPARTMENT AT [facility] Provider Note  CSN: [id number] Arrival date & time: 02/04/24 9145  Chief Complaint(s) No chief complaint on file.  HPI Misty Dodson is a 88 y.o. female with PMH anxiety with panic attacks, GERD, HTN who presents emerged part for evaluation of a fall.  Patient states that she was walking to the mailbox when she tripped and fell on both knees.  States that she was able to stand immediately after the fall but was in pain overnight and now is unable to bear weight particularly on the left knee.  She arrives with swelling to the left knee and a abrasion and swelling over the left wrist.  Denies head strike or loss of consciousness.  Denies syncope.  Denies chest pain, shortness of breath, abdominal pain, nausea, vomiting or other systemic symptoms.   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 Authorizing Provider  acetaminophen  (TYLENOL ) 500 MG tablet Take 2 tablets (1,000 mg total) by mouth every 8 (eight) hours. 02/04/24 03/05/24  Callee Rohrig, MD  ciprofloxacin  (CIPRO ) 250 MG tablet Take 1 tablet (250 mg total) by mouth 2 (two) times daily. 02/24/14   Gaston Hamilton, MD  esomeprazole (NEXIUM) 20 MG capsule Take 20 mg by mouth daily at 12 noon.    [provider]  gabapentin  (NEURONTIN ) 300 MG capsule Take 300 mg by mouth 2 (two) times daily.    [provider]  ibuprofen  (ADVIL ,MOTRIN ) 800 MG tablet Take 1 tablet (800 mg  total) by mouth every 8 (eight) hours as needed (mild pain). 02/24/14   [name], Jody, MD  Melatonin 10 MG CAPS Take 1 capsule by mouth at bedtime as needed (For sleep.).    [provider]  naproxen  (NAPROSYN ) 375 MG tablet Take 1 tablet (375 mg total) by mouth 2 (two) times daily. 02/04/24   Hellena Pridgen, MD  oxyCODONE  (ROXICODONE ) 5 MG immediate release tablet Take 1 tablet (5 mg total) by mouth every 6 (six) hours as needed for breakthrough pain. 02/04/24   Mardene Lessig, MD  oxyCODONE -acetaminophen  (PERCOCET/ROXICET) 5-325 MG per tablet Take 1-2 tablets by mouth every 6 (six) hours as needed for severe pain (moderate to severe pain (when tolerating fluids)). 02/24/14   [name], Jody, MD  polyethylene glycol (MIRALAX  / GLYCOLAX ) packet Take 17 g by mouth daily. 02/24/14   [name], Jody, MD  propranolol  (INDERAL ) 10 MG tablet Take 10 mg by mouth daily.    [provider]  zolpidem  (AMBIEN ) 10 MG tablet Take 5 mg by mouth at bedtime as needed for sleep.    [provider]  Past Surgical History Past Surgical History:  Procedure Laterality Date   ABDOMINAL HYSTERECTOMY     ANTERIOR AND POSTERIOR REPAIR N/A 02/23/2014   Procedure: ANTERIOR (CYSTOCELE) AND POSTERIOR REPAIR (RECTOCELE);  Surgeon: Ezzie Buba, MD;  Location: [facility];  Service: Gynecology;  Laterality: N/A;   BACK SURGERY     several times   CYSTOSCOPY N/A 02/23/2014   Procedure: CYSTOSCOPY;  Surgeon: Glendia DELENA Elizabeth, MD;  Location: [facility];  Service: Urology;  Laterality: N/A;   FOOT SURGERY     PUBOVAGINAL SLING N/A 02/23/2014   Procedure: CARLOYN GLADE;  Surgeon: Glendia DELENA Elizabeth, MD;  Location: [facility];  Service: Urology;  Laterality: N/A;   TONSILLECTOMY     Family History History reviewed. No pertinent family history.  Social  History Social History   Tobacco Use   Smoking status: Former  Substance Use Topics   Alcohol use: Yes    Comment: wine-daily   Drug use: No   Allergies Patient has no known allergies.  Review of Systems Review of Systems  Musculoskeletal:  Positive for arthralgias, joint swelling and myalgias.    Physical Exam Vital Signs  I have reviewed the triage vital signs BP (!) 146/76   Pulse 62   Temp 98.2 F (36.8 C)   Resp 16   SpO2 99%   Physical Exam Vitals and nursing note reviewed.  Constitutional:      General: She is not in acute distress.    Appearance: She is well-developed.  HENT:     Head: Normocephalic and atraumatic.  Eyes:     Conjunctiva/sclera: Conjunctivae normal.  Cardiovascular:     Rate and Rhythm: Normal rate and regular rhythm.     Heart sounds: No murmur heard. Pulmonary:     Effort: Pulmonary effort is normal. No respiratory distress.     Breath sounds: Normal breath sounds.  Abdominal:     Palpations: Abdomen is soft.     Tenderness: There is no abdominal tenderness.  Musculoskeletal:        General: Swelling and tenderness present.     Cervical back: Neck supple.  Skin:    General: Skin is warm and dry.     Capillary Refill: Capillary refill takes less than 2 seconds.     Findings: Erythema present.  Neurological:     Mental Status: She is alert.  Psychiatric:        Mood and Affect: Mood normal.     ED Results and Treatments Labs (all labs ordered are listed, but only abnormal results are displayed) Labs Reviewed - No data to display                                                                                                                        Radiology CT Knee Left Wo Contrast Result Date: 02/04/2024 CLINICAL DATA:  New knee pain and swelling after falling yesterday. EXAM: CT OF THE LEFT KNEE WITHOUT CONTRAST TECHNIQUE: Multidetector CT imaging of the left  knee was performed according to the standard protocol. Multiplanar  CT image reconstructions were also generated. RADIATION DOSE REDUCTION: This exam was performed according to the departmental dose-optimization program which includes automated exposure control, adjustment of the mA and/or kV according to patient size and/or use of iterative reconstruction technique. COMPARISON:  Radiographs 02/04/2024. FINDINGS: Bones/Joint/Cartilage There is a large knee joint effusion with possible subtle lipohemarthrosis. The bones appear mildly demineralized. As suggested on earlier radiographs, there is strong suspicion of a nondisplaced transverse fracture through the lower pole of the patella, best seen on the reformatted images. No other evidence of acute fracture or dislocation. The distal femur, proximal tibia and proximal fibula appear intact. Minimal medial and lateral compartment joint space narrowing for age. Ligaments Suboptimally assessed by CT. Muscles and Tendons The extensor mechanism is intact. No focal muscular abnormalities are identified. Soft tissues Mild prepatellar soft tissue swelling. Moderate-sized Baker's cyst. No evidence of foreign body or soft tissue emphysema. IMPRESSION: 1. Large knee joint effusion with possible subtle lipohemarthrosis. 2. Strong suspicion of a nondisplaced transverse fracture through the lower pole of the patella. This has the potential to become displaced with continued weight-bearing. 3. No other evidence of acute fracture or dislocation. 4. Moderate-sized Baker's cyst. Electronically Signed   By: Elsie Perone M.D.   On: 02/04/2024 13:16   DG Wrist Complete Left Result Date: 02/04/2024 CLINICAL DATA:  fall r/o fx EXAM: LEFT WRIST - COMPLETE 3+ VIEW COMPARISON:  None Available. FINDINGS: Diffuse osteopenia.No acute fracture or dislocation. Severe joint space loss of the first carpometacarpal and triscaphe joints. Moderate joint space loss of the radiocarpal joint. Soft tissues are unremarkable. IMPRESSION: 1. Diffuse osteopenia.  No acute  fracture or dislocation. 2. 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 Electronically Signed   By: Rogelia Myers M.D.   On: 02/04/2024 10:46   DG Knee Complete 4 Views Left Result Date: 02/04/2024 CLINICAL DATA:  Status post fall with new left knee swelling and pain EXAM: LEFT KNEE - COMPLETE 4+ VIEW COMPARISON:  None Available. FINDINGS: There are no findings of acute displaced fracture or dislocation. Subtle focus of cortical discontinuity along the inferior patella. Moderate joint effusion. Mild degenerative changes of the knee. Soft tissues are unremarkable. IMPRESSION: 1. Subtle focus of cortical discontinuity along the inferior patella, which may represent a nondisplaced fracture. 2. Moderate joint effusion. Electronically Signed   By: Limin  Xu M.D.   On: 02/04/2024 10:42    Pertinent labs & imaging results that were available during my care of the patient were reviewed by me and considered in my medical decision making (see MDM for details).  Medications Ordered in ED Medications  ondansetron  (ZOFRAN -ODT) disintegrating tablet 4 mg (4 mg Oral Given 02/04/24 0930)  morphine  (PF) 4 MG/ML injection 4 mg (4 mg Intramuscular Given 02/04/24 0931)  HYDROcodone -acetaminophen  (NORCO/VICODIN) 5-325 MG per tablet 1 tablet (1 tablet Oral Given 02/04/24 1311)  naproxen  (NAPROSYN ) tablet 500 mg (500 mg Oral Given 02/04/24 1311)  Procedures Procedures  (including critical care time)  Medical Decision Making / ED Course   This patient presents to the ED for concern of fall, knee pain, this involves an extensive number of treatment options, and is a complaint that carries with it a high risk of complications and morbidity.  The differential diagnosis includes fracture, hematoma, dislocation, contusion,  laceration  MDM: Patient seen emergency room for evaluation of a fall with knee pain.  Physical exam with an abrasion over the right knee as well as the left knee but significant swelling and tenderness to the left knee.  No pain with extension but significant pain with knee flexion.  There is also erythema and tenderness to the left wrist.  X-ray of the wrist is reassuringly negative outside of baseline arthritis.  X-ray of the knee showing possible nondisplaced patellar fracture.  Follow-up CT also showing concern for nondisplaced patellar fracture.  Patient placed in a knee immobilizer and will use a walker to ambulate.  I did encourage the patient not to weight-bear on this leg as this may worsen the level of displacement.  She states she will call her primary orthopedic surgeon Dr. Georgina today for close follow-up.  At this time she does not meet inpatient criteria for admission will be discharged with outpatient follow-up.   Additional history obtained: -Additional history obtained from husband -External records from outside source obtained and reviewed including: Chart review including previous notes, labs, imaging, consultation notes   Imaging Studies ordered: I ordered imaging studies including wrist x-ray, knee x-ray, CT I independently visualized and interpreted imaging. I agree with the radiologist interpretation   Medicines ordered and prescription drug management: Meds ordered this encounter  Medications   DISCONTD: morphine  (PF) 4 MG/ML injection 4 mg   ondansetron  (ZOFRAN -ODT) disintegrating tablet 4 mg   morphine  (PF) 4 MG/ML injection 4 mg   DISCONTD: naproxen  (NAPROSYN ) 375 MG tablet    Sig: Take 1 tablet (375 mg total) by mouth 2 (two) times daily.    Dispense:  20 tablet    Refill:  0   DISCONTD: acetaminophen  (TYLENOL ) 500 MG tablet    Sig: Take 2 tablets (1,000 mg total) by mouth every 8 (eight) hours.    Dispense:  180 tablet    Refill:  0   DISCONTD: oxyCODONE   (ROXICODONE ) 5 MG immediate release tablet    Sig: Take 1 tablet (5 mg total) by mouth every 6 (six) hours as needed for breakthrough pain.    Dispense:  10 tablet    Refill:  0   HYDROcodone -acetaminophen  (NORCO/VICODIN) 5-325 MG per tablet 1 tablet    Refill:  0   naproxen  (NAPROSYN ) tablet 500 mg   acetaminophen  (TYLENOL ) 500 MG tablet    Sig: Take 2 tablets (1,000 mg total) by mouth every 8 (eight) hours.    Dispense:  180 tablet    Refill:  0   naproxen  (NAPROSYN ) 375 MG tablet    Sig: Take 1 tablet (375 mg total) by mouth 2 (two) times daily.    Dispense:  20 tablet    Refill:  0   oxyCODONE  (ROXICODONE ) 5 MG immediate release tablet    Sig: Take 1 tablet (5 mg total) by mouth every 6 (six) hours as needed for breakthrough pain.    Dispense:  10 tablet    Refill:  0    -I have reviewed the patients home medicines and have made adjustments as needed  Critical interventions none    Cardiac  Monitoring: The patient was maintained on a cardiac monitor.  I personally viewed and interpreted the cardiac monitored which showed an underlying rhythm of: NSR  Social Determinants of Health:  Factors impacting patients care include: none   Reevaluation: After the interventions noted above, I reevaluated the patient and found that they have :improved  Co morbidities that complicate the patient evaluation  Past Medical History:  Diagnosis Date   Anxiety    occasional panic attacks   Family history of anesthesia complication    son's heart stopped during knee repair age 74Yrs   GERD (gastroesophageal reflux disease)    Heart murmur    Hypertension    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      Dispostion: I considered admission for this patient, but at this time she does not meet inpatient criteria for admission or discharge with outpatient follow-up.     Final Clinical Impression(s) / ED Diagnoses Final diagnoses:  Closed nondisplaced transverse fracture of left  patella, initial encounter     @PCDICTATION @    Albertina Dixon, MD 02/04/24 1333

## 2024-02-04 NOTE — ED Triage Notes (Signed)
 BIBA from home, fell yesterday and complaints of new left knee swelling and pain when she bends her leg

## 2024-02-14 ENCOUNTER — Encounter: Admitting: Physical Therapy

## 2024-02-21 ENCOUNTER — Encounter: Admitting: Physical Therapy

## 2024-02-25 ENCOUNTER — Encounter: Admitting: Physical Therapy

## 2024-02-28 ENCOUNTER — Ambulatory Visit: Attending: Sports Medicine | Admitting: Physical Therapy

## 2024-02-28 DIAGNOSIS — R262 Difficulty in walking, not elsewhere classified: Secondary | ICD-10-CM | POA: Diagnosis present

## 2024-02-28 DIAGNOSIS — M6281 Muscle weakness (generalized): Secondary | ICD-10-CM | POA: Insufficient documentation

## 2024-02-28 DIAGNOSIS — M25561 Pain in right knee: Secondary | ICD-10-CM | POA: Diagnosis present

## 2024-02-28 DIAGNOSIS — M25661 Stiffness of right knee, not elsewhere classified: Secondary | ICD-10-CM | POA: Insufficient documentation

## 2024-02-28 DIAGNOSIS — R2689 Other abnormalities of gait and mobility: Secondary | ICD-10-CM | POA: Insufficient documentation

## 2024-02-28 DIAGNOSIS — G8929 Other chronic pain: Secondary | ICD-10-CM | POA: Insufficient documentation

## 2024-02-28 NOTE — Therapy (Signed)
 OUTPATIENT PHYSICAL THERAPY LOWER EXTREMITY TREATMENT NOTE   Patient Name: Misty Dodson MRN: [id number] DOB:1935-08-29, 88 y.o., female Today's Date: 02/28/2024  END OF SESSION:  PT End of Session - 02/28/24 0837     Visit Number 3    Date for PT Re-Evaluation 03/18/24    Authorization Type BC[employer] Medicare    Progress Note Due on Visit 10    PT Start Time [phone]    PT Stop Time 0920    PT Time Calculation (min) 42 min    Activity Tolerance Patient tolerated treatment well           Past Medical History:  Diagnosis Date   Anxiety    occasional panic attacks   Family history of anesthesia complication    son's heart stopped during knee repair age 13Yrs   GERD (gastroesophageal reflux disease)    Heart murmur    Hypertension    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   Past Surgical History:  Procedure Laterality Date   ABDOMINAL HYSTERECTOMY     ANTERIOR AND POSTERIOR REPAIR N/A 02/23/2014   Procedure: ANTERIOR (CYSTOCELE) AND POSTERIOR REPAIR (RECTOCELE);  Surgeon: Ezzie Buba, MD;  Location: [facility];  Service: Gynecology;  Laterality: N/A;   BACK SURGERY     several times   CYSTOSCOPY N/A 02/23/2014   Procedure: CYSTOSCOPY;  Surgeon: Glendia DELENA Elizabeth, MD;  Location: [facility];  Service: Urology;  Laterality: N/A;   FOOT SURGERY     PUBOVAGINAL SLING N/A 02/23/2014   Procedure: CARLOYN GLADE;  Surgeon: Glendia DELENA Elizabeth, MD;  Location: [facility];  Service: Urology;  Laterality: N/A;   TONSILLECTOMY     Patient Active Problem List   Diagnosis Date Noted   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    PCP: Pura Lenis MD  REFERRING PROVIDER: Georgina Bloodgood MD  REFERRING DIAG: [phone] s/p right TKR, R26.89 poor balance  THERAPY DIAG:  Right knee pain, weakness  Rationale for Evaluation and Treatment: Rehabilitation  ONSET DATE: April 2024  SUBJECTIVE:   SUBJECTIVE STATEMENT: Per chart in EPIC: Patient fell 7/15  resulting in left patellar closed fracture went to ED, saw dr 7/24 and instructed to wear knee immobilizer when up (not at rest); Patient arrives without knee immobilizer today; states she  tripped going to the mailbox fell;  Next follow up with the doctor 8/24. Both of my knees hurt this morning.    Eval: I think this (right) leg is longer and I was unsteady in April when I went to my 1 year ortho follow up from TKR.  I still do my exercises every day. I have pain at night every night.  Now the left knee has started to hurt.  Some near misses with falls but no real falls.  I walk the dog with no problem.  Difficulty from getting up from the kneeling position at church.    PERTINENT HISTORY:  right TKR  11/06/22 with rehab at [facility] HTN; GERD Right shoulder replacement Hard of hearing wears hearing aide PAIN:  Are you having pain? Yes NPRS scale: 5/10 Pain location: both knees Pain orientation: Right and Left  PAIN TYPE: throbbing Pain description: intermittent  Aggravating factors: night time, lying with knee bent Relieving factors: walking OK   PRECAUTIONS: Fall   WEIGHT BEARING RESTRICTIONS: No  FALLS:  Has patient fallen in last 6 months? No  LIVING ENVIRONMENT: Lives with: lives with their spouse Lives in: House/apartment Stairs: main living and bedroom  downstairs; husband uses the space upstairs more than she does    OCCUPATION: retired  PLOF: Independent  PATIENT GOALS: be able to run across the fairway (fall forward); I haven't done stairs in a long time   OBJECTIVE:  Note: Objective measures were completed at Evaluation unless otherwise noted.  DIAGNOSTIC FINDINGS: none at the 1 year TKR mark per pt report  PATIENT SURVEYS:  LEFS  Extreme difficulty/unable (0), Quite a bit of difficulty (1), Moderate difficulty (2), Little difficulty (3), No difficulty (4) Survey date:     7/2  Any of your usual work, housework or school activities   2. Usual hobbies,  recreational or sporting activities   3. Getting into/out of the bath   4. Walking between rooms   5. Putting on socks/shoes   6. Squatting    7. Lifting an object, like a bag of groceries from the floor   8. Performing light activities around your home   9. Performing heavy activities around your home   10. Getting into/out of a car   11. Walking 2 blocks   12. Walking 1 mile   13. Going up/down 10 stairs (1 flight)   14. Standing for 1 hour   15.  sitting for 1 hour   16. Running on even ground   17. Running on uneven ground   18. Making sharp turns while running fast   19. Hopping    20. Rolling over in bed   Score total:  70/80     COGNITION: Overall cognitive status: Within functional limits for tasks assessed     MUSCLE LENGTH: Good hamstring length bil  LOWER EXTREMITY ROM: Right: Seated knee extension lacks 8 degrees Right: Supine knee ROM 5-137 degrees  LOWER EXTREMITY MMT: Right quads 4/5:  with 6 inch step down test = genu valgus/hip adduction Right glutes 4/5   FUNCTIONAL TESTS:  Able to rise sit to stand without UE assist but with knees adducted/internally rotated (touching) To pick up a small object from the floor she uses mostly a hip hinge strategy and less knee flexion SLS: left 3 sec, right 0 sec with pelvic drop  GAIT:  Good gait speed; no assistive device needed   MINI-BESTest: Balance Evaluation Systems Test ANTICIPATORY:   SIT TO STAND: (2) normal without use of hands and stabilizes independently     (1) Moderate comes to stand WITH use of hands on 1st attempt    (0) Severe: unable to stand up from chair without assistance OR needs several attempts with use of   hands Score: 2  2. RISE TO TOES: feet shoulder width apart. Hands on hips.  Rise as high as you can onto your toes. Try to hold this pose for 3 sec                          (2) stable for 3s with maximum height    (1) heels up, but not full range (smaller than when holding hands) OR  noticeable instability for 3 sec                           (0) < or equat to 3 s  Score: 2  3. STAND ON ONE LEG: look straight ahead. Hands on hips. Lift your leg off the ground.     Left:  trial 1 __0__ trial 2____  Right: Trial 1:__0___   Trial 2:_______   (2) 20 s       (2)  20 s                (1) < 20      (1) < 20 s   (0) Unable      (0) unable Score (use the worst side):  0  REACTIVE POSTURAL CONTROL 4. COMPENSATORY STEPPING CORRECTION FORWARD stand with feet shoulder width apart, arms at your sides. Lean forward against my hands beyond your forward limits.  When I let go, do whatever is necessary, including taking a step, to avoid a fall   (2) recovers independently  with a single, large step, to avoid a fall   (1) more than one step used to recover equilibrium   (0) No step OR would fall if not caught Score: 0  5. COMPENSATORY STEPPING CORRECTION BACKWARD: Lean backward against my hands beyond your backward limits.  When I let go, do whatever is necessary, including a step to avoid a fall. (2) recovers independently  with a single, large step, to avoid a fall   (1) more than one step used to recover equilibrium   (0) No step OR would fall if not caught Score: 0  6. COMPENSATORY STEPPING LATERAL: Lean into my hand beyond your sideways limit.  When I let go, do whatever is necessary, including taking a step, to avoid a fall. Left           Right   (2) recovers independently with 1 step (crossover or lateral OK)   (1) several steps to recover equilibrium   (0) falls or cannot step Score:   2  (use the side with the lowest score)  SENSORY ORIENTATION 7. STANCE FEET TOGETHER EYES OPEN  firm surface Be as stable and still as possible until I say stop   (2) 30s   (1) < 30 s   (0) unable Score: 2  8. STANCE ON FOAM EYES CLOSED feet together, eyes closed, hands on hips. Be as stable and still as you can until I stay stop.  I will start timing  when you close your eyes. Time in seconds:      (2) 30s   (1) <30 s   (0) unable Score: 0  9. INCLINE EYES CLOSED: Stand on incline with feet shoulder width apart, arms down at your sides.  I will start timing when you close your eyes   (2) Stands 30 s and aligns with gravity   (1) stands < 30 s OR aligns with surface   (0) unable Score: 1  DYNAMIC GAIT 10. CHANGE IN GAIT SPEED: begin walking at your normal speed, when I tell you fast, walk as fast as you can, when I say slow walk very slowly   (2) significantly changes walking speed without imbalance   (1) unable to change walking speed or signs of imbalance   (0) unable to achieve significant change in walking speed AND signes of imbalance Score:  2  11. WALK WITH HEAD TURNS HORIZONTAL: begin walking at normal speed, when I say right, turn your head to the right, when I say left turn your head to the left.  Try to keep yourself walking in a straight line   (2) performs head turns with no change in gait speed and good balance   (1) performs head turns with reduction in gait speed   (0)  performs head turns with imbalance  Score: 2  12. WALK WITH PIVOT TURNS: begin walking at your normal speed.  When I tell you to turn and stop, turn as quickly as you can, face the opposite direction and stop.  After the turn, your feet should be close together   (2) turns with feet close FAST in 3 steps with good balance   (1) turns with feet close SLOW > 4 steps with good balance   (0) cannot turn with feet close at any speed without imbalance Score: 2  13. STEP OVER OBSTACLES:  ( 9 inch height 10 feet away from subject) begin walking at your normal speed.  When you get to the box, step over it, not around it and keep walking   (2) Able to step over box with minimal change of gait speed and good balance   (1) Steps over box but touches box OR displays cautious behavior by slowing gait   (0) Unable to step over box OR steps around box Score:  2  14. TIMED UP AND GO NORMAL AND WITH DUAL TASK: 3 METER WALK: When I say go walk at your your normal speed across the tape, turn around and come back to sit in the chair   Time: Count backwards by 3s starting at    20.  When I say go, stand up from the chair, walk at your normal speed across the tape on the floor, turn around, come back to sit in the chair.  Continue counting backwards the entire time.  Time:   (2) No noticeable change in sitting, standing or walking while backward counting when compared to TUG without dual task   (1) Dual task affects either counting OR walking (>10%)    (0) stops counting while walking OR stops walking while counting If gait speed slows more than 10% between TUG without and with dual task, the score should be decreased by a point Score:  not performed on eval  Total score:        17   /26 (# 14 not performed)                                                                                                                                 TREATMENT DATE:  02/28/24 Discussion of new diagnosis and modifications for fracture healing  Seated yellow loop hip abduction 15x right/left  Sit to stand from high mat table, left foot on yoga block to avoid left UE use Standing in staggered stance with left leg back (no weight bearing) hip hinge 10x, added red power cord under foot 10x Standing purple heavy power cord TKEs 5 sec hold 12x Seated green medium power cord single leg press 20x Seated HS curls red power cord 10x Standing leaning on tall mat table: left only hip extension 10x, left only hip abduction 10x Standing holding chair for balance WB on right LE  single leg RDL 10x to touch stool  10x       02/03/24 Recumbent bike L3 x 4' PT present to discuss status Seated HS stretch on Rt with application of pressure to distal thigh for knee extension stretch 2x30 Sit to stand from chair with mirror for biofeedback for knee valgus control, slow motion  eccentric stand to sit with VC for eyes on knees Seated clam with yellow loop band x20 then more reps of sit to stand with improved control of knees bil At counter: blue loop x10 each Rt hip abd and ext, then no loop plank on elbows on counter with Rt LE donkey kick to ceiling for glut isolation with PT tapping glut max TKE with thin red power cord and 2 lateral step up holding on single vertical upright x15 for quad and hip control Deadlift holding 5lb x8 Bwd resistance walking x5 with 10lb for posterior chain strength and righting mechanism to prevent forward fall, eccentric quads on walk back in Standing with back to corner eyes closed feet together 2x20 Standing in stagger stance back to corner with trunk perturbations by PT in both foot positions Standing in square stance with trunk perturbations by PT Single LE stance on Rt starting  Seated fig 4 Rt x1 while discussing HEP during treavels HEP - keep it simple for now b/c Pt traveling - gave yellow tied band for standing counter hip abd and ext, Pt will keep working on back to corner balance with eyes closed and SLS at counter with min single hand/fingers to maintain control   01/22/24 evaluation  Plan of care: quad strengthening, balance (single leg, sensory orientation and reactive postural control balance) Discussed practicing SLS next to kitchen counter Discussed eyes closed practice in the corner (back to the corner)   PATIENT EDUCATION:  Education details: Educated patient on anatomy and physiology of current symptoms, prognosis, plan of care as well as initial self care strategies to promote recovery Person educated: Patient Education method: Explanation Education comprehension: verbalized understanding  HOME EXERCISE PROGRAM: Access Code: [email or web address] URL: [email or web address] Date: 02/03/2024 Prepared by: Orvil Beuhring  Exercises - Standing Hip Abduction with Resistance at Ankles and Counter Support  - 2  x daily - 7 x weekly - 1 sets - 10 reps - Standing Hip Extension with Resistance at Ankles and Counter Support  - 2 x daily - 7 x weekly - 1 sets - 10 reps  ASSESSMENT:  CLINICAL IMPRESSION: Patient has had a change in status since last visit.  She tripped and fell fracturing her left patella.  Orthopedist office notes recommend a knee immobilizer however she is not wearing it today.  Treatment heavily modified to avoid left LE use with focus on right single leg weight bearing and strengthening.  We did not perform single weight bearing on the left or any ROM type ex's for the left knee (no bike or Nu-Step).  She follows up with the orthopedist on 8/24.  Discussed holding PT until after that appt but the patient would like to be seen prior to the MD appt.  We discussed the plan of similar treatment sessions until she has fracture healing and is cleared by the orthopedist.  No STGS met secondary to the change in status.   Eval: Patient is a 88 y.o. female who was seen today for physical therapy evaluation and treatment for right knee pain and balance impairment > 1 year s/p TKR.  [name]'s ROM is much improved, particularly with extension, since she finished PT in September. Current right knee  ROM 5-137 degrees.   Strength is good overall although weakness in right glutes and quads affect her ability to rise from a chair without compensations (hip adduction/knee internal rotation) and rise from a kneeling position at church. Mini-BEST test indicates impairments noted with eyes closed, single leg standing and reactive postural control balance and therefore at risk for falls.  Her pain is mild at the moment and mostly at night time.  She would benefit from PT to address these deficits needed for community mobility, upcoming travel and to decrease risk of falls.      OBJECTIVE IMPAIRMENTS: decreased activity tolerance, decreased balance, decreased mobility, difficulty walking, decreased ROM, decreased strength,  increased edema, impaired perceived functional ability, impaired flexibility, and pain.   ACTIVITY LIMITATIONS: lifting, bending, standing, squatting, sleeping, stairs, transfers, bed mobility, and locomotion level   PARTICIPATION LIMITATIONS: shopping, community activity, and yard work   PERSONAL FACTORS: Good general health positively affect patient's functional outcome.    REHAB POTENTIAL: Good   CLINICAL DECISION MAKING: Stable/uncomplicated   EVALUATION COMPLEXITY: Low GOALS: Goals reviewed with patient? Yes  SHORT TERM GOALS: Target date: 02/19/2024   The patient will demonstrate knowledge of basic self care strategies and exercises to promote healing  Baseline: Goal status: INITIAL   2.  Able to single leg balance for 3-5 sec on right needed for ascending/descending curbs while traveling to [employer]  Baseline:  Goal status: INITIAL   3.  Patient will be able to rise from a standard height chair with knees in alignment/ without compensatory strategies in order to decrease pain in both knees Baseline:  Goal status: INITIAL    LONG TERM GOALS: Target date: 03/18/2024   The patient will be independent in a safe self progression of a home exercise program to promote further recovery of function and relieve pain Baseline:  Goal status: INITIAL   2.  Patient will report a 50% improvement in rising from a kneeling position at church Baseline:  Goal status: INITIAL   3.  Improved balance with Mini-BEST balance score improved to at least 20 indicating a decreased risk of falls  Baseline:  Goal status: INITIAL   4.  Improved strength needed for ascending/descending steps while traveling  Baseline:  Goal status: INITIAL   5.  LE strength needed to meet her personal goal of running across the fairway  Baseline:  Goal status: INITIAL   PLAN:  PT FREQUENCY: 2x/week  PT DURATION: 8 weeks  PLANNED INTERVENTIONS: 02835- PT Re-evaluation, 97110-Therapeutic  exercises, 97530- Therapeutic activity, 97112- Neuromuscular re-education, 97535- Self Care, 02859- Manual therapy, [phone]- Aquatic Therapy, [id number]- Electrical stimulation (unattended), [phone]- Electrical stimulation (manual), [id number]- Vasopneumatic device, Patient/Family education, Balance training, Stair training, Taping, Joint mobilization, Cryotherapy, and Moist heat  PLAN FOR NEXT SESSION: new left patellar fracture will avoid left knee ROM and strengthening until cleared by ortho 8/24; single leg balance; balance with perturbances; balance with eyes closed; 6 inch step downs for strength (pt not a fan of the leg press); standing at the sink with backwards mini lunge    Glade Pesa, PT 02/28/24 12:39 PM Phone: [phone] Fax: [phone]  Phone: [phone] Fax: [phone]

## 2024-03-04 ENCOUNTER — Ambulatory Visit: Admitting: Physical Therapy

## 2024-03-06 ENCOUNTER — Encounter: Admitting: Physical Therapy

## 2024-03-10 ENCOUNTER — Ambulatory Visit: Admitting: Physical Therapy

## 2024-03-10 DIAGNOSIS — M6281 Muscle weakness (generalized): Secondary | ICD-10-CM | POA: Diagnosis not present

## 2024-03-10 DIAGNOSIS — G8929 Other chronic pain: Secondary | ICD-10-CM

## 2024-03-10 DIAGNOSIS — R2689 Other abnormalities of gait and mobility: Secondary | ICD-10-CM

## 2024-03-10 NOTE — Therapy (Signed)
 OUTPATIENT PHYSICAL THERAPY LOWER EXTREMITY TREATMENT NOTE   Patient Name: Misty Dodson MRN: [id number] DOB:[birth year], 88 y.o., female Today's Date: 03/10/2024  END OF SESSION:  PT End of Session - 03/10/24 0933     Visit Number 4    Date for PT Re-Evaluation 03/18/24    Authorization Type [employer] Medicare    Progress Note Due on Visit 10    PT Start Time 0930    PT Stop Time 1010    PT Time Calculation (min) 40 min    Activity Tolerance Patient tolerated treatment well           Past Medical History:  Diagnosis Date   Anxiety    occasional panic attacks   Family history of anesthesia complication    son's heart stopped during knee repair age 53Yrs   GERD (gastroesophageal reflux disease)    Heart murmur    Hypertension    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   Past Surgical History:  Procedure Laterality Date   ABDOMINAL HYSTERECTOMY     ANTERIOR AND POSTERIOR REPAIR N/A 02/23/2014   Procedure: ANTERIOR (CYSTOCELE) AND POSTERIOR REPAIR (RECTOCELE);  Surgeon: Ezzie Buba, MD;  Location: [facility];  Service: Gynecology;  Laterality: N/A;   BACK SURGERY     several times   CYSTOSCOPY N/A 02/23/2014   Procedure: CYSTOSCOPY;  Surgeon: Glendia DELENA Elizabeth, MD;  Location: [facility];  Service: Urology;  Laterality: N/A;   FOOT SURGERY     PUBOVAGINAL SLING N/A 02/23/2014   Procedure: CARLOYN GLADE;  Surgeon: Glendia DELENA Elizabeth, MD;  Location: [facility];  Service: Urology;  Laterality: N/A;   TONSILLECTOMY     Patient Active Problem List   Diagnosis Date Noted   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 Will go in  wheelchair.  Mild left knee edema and mild bruising.  Walking 1 1/2 miles.  In the middle of the night, the pain was awful I got up and took something. AM stiffness now.  Eval: I think this (right) leg is longer and I was unsteady in April when I went to my 1 year ortho follow up from TKR.  I still do my exercises every day. I have pain at night every night.  Now the left knee has started to hurt.  Some near misses with falls but no real falls.  I walk the dog with no problem.  Difficulty from getting up from the kneeling position at church.    PERTINENT HISTORY:  right TKR  11/06/22 with rehab at [facility] HTN; GERD Right shoulder replacement Hard of hearing wears hearing aide PAIN:  Are you having pain? Yes NPRS scale: 3/10 earlier this morning then it goes away Pain location: both knees Pain orientation: Right and Left  PAIN TYPE: throbbing Pain description: intermittent  Aggravating factors: night time, lying with knee bent Relieving factors: walking OK   PRECAUTIONS: Fall   WEIGHT BEARING RESTRICTIONS: No  FALLS:  Has patient fallen in last 6 months? No  LIVING ENVIRONMENT: Lives with: lives with their spouse Lives in: House/apartment Stairs: main living and bedroom downstairs; husband uses the space upstairs more than she does  OCCUPATION: retired  PLOF: Independent  PATIENT GOALS: be able to run across the fairway (fall forward); I haven't done stairs in a long time   OBJECTIVE:  Note: Objective measures were completed at Evaluation unless otherwise noted.  DIAGNOSTIC FINDINGS: none at the 1 year TKR mark per pt report  PATIENT SURVEYS:  LEFS  Extreme difficulty/unable (0), Quite a bit of difficulty (1), Moderate difficulty (2), Little difficulty (3), No difficulty (4) Survey date:     7/2  Any of your usual work, housework or school activities   2. Usual hobbies, recreational or sporting activities   3. Getting into/out of the bath   4. Walking between  rooms   5. Putting on socks/shoes   6. Squatting    7. Lifting an object, like a bag of groceries from the floor   8. Performing light activities around your home   9. Performing heavy activities around your home   10. Getting into/out of a car   11. Walking 2 blocks   12. Walking 1 mile   13. Going up/down 10 stairs (1 flight)   14. Standing for 1 hour   15.  sitting for 1 hour   16. Running on even ground   17. Running on uneven ground   18. Making sharp turns while running fast   19. Hopping    20. Rolling over in bed   Score total:  70/80     COGNITION: Overall cognitive status: Within functional limits for tasks assessed     MUSCLE LENGTH: Good hamstring length bil  LOWER EXTREMITY ROM: Right: Seated knee extension lacks 8 degrees Right: Supine knee ROM 5-137 degrees  LOWER EXTREMITY MMT: Right quads 4/5:  with 6 inch step down test = genu valgus/hip adduction Right glutes 4/5   FUNCTIONAL TESTS:  Able to rise sit to stand without UE assist but with knees adducted/internally rotated (touching) To pick up a small object from the floor she uses mostly a hip hinge strategy and less knee flexion SLS: left 3 sec, right 0 sec with pelvic drop  GAIT:  Good gait speed; no assistive device needed   MINI-BESTest: Balance Evaluation Systems Test ANTICIPATORY:   SIT TO STAND: (2) normal without use of hands and stabilizes independently     (1) Moderate comes to stand WITH use of hands on 1st attempt    (0) Severe: unable to stand up from chair without assistance OR needs several attempts with use of   hands Score: 2  2. RISE TO TOES: feet shoulder width apart. Hands on hips.  Rise as high as you can onto your toes. Try to hold this pose for 3 sec                          (2) stable for 3s with maximum height    (1) heels up, but not full range (smaller than when holding hands) OR noticeable instability for 3 sec                           (0) < or equat to 3 s  Score:  2  3. STAND ON ONE LEG: look straight ahead. Hands on hips. Lift your leg off the ground.     Left:  trial 1 __0__ trial 2____  Right: Trial 1:__0___   Trial 2:_______   (2) 20 s       (2)  20 s                (1) < 20      (1) < 20 s   (0) Unable      (0) unable Score (use the worst side):  0  REACTIVE POSTURAL CONTROL 4. COMPENSATORY STEPPING CORRECTION FORWARD stand with feet shoulder width apart, arms at your sides. Lean forward against my hands beyond your forward limits.  When I let go, do whatever is necessary, including taking a step, to avoid a fall   (2) recovers independently  with a single, large step, to avoid a fall   (1) more than one step used to recover equilibrium   (0) No step OR would fall if not caught Score: 0  5. COMPENSATORY STEPPING CORRECTION BACKWARD: Lean backward against my hands beyond your backward limits.  When I let go, do whatever is necessary, including a step to avoid a fall. (2) recovers independently  with a single, large step, to avoid a fall   (1) more than one step used to recover equilibrium   (0) No step OR would fall if not caught Score: 0  6. COMPENSATORY STEPPING LATERAL: Lean into my hand beyond your sideways limit.  When I let go, do whatever is necessary, including taking a step, to avoid a fall. Left           Right   (2) recovers independently with 1 step (crossover or lateral OK)   (1) several steps to recover equilibrium   (0) falls or cannot step Score:   2  (use the side with the lowest score)  SENSORY ORIENTATION 7. STANCE FEET TOGETHER EYES OPEN  firm surface Be as stable and still as possible until I say stop   (2) 30s   (1) < 30 s   (0) unable Score: 2  8. STANCE ON FOAM EYES CLOSED feet together, eyes closed, hands on hips. Be as stable and still as you can until I stay stop.  I will start timing when you close your eyes. Time in seconds:      (2) 30s   (1) <30 s   (0) unable Score:  0  9. INCLINE EYES CLOSED: Stand on incline with feet shoulder width apart, arms down at your sides.  I will start timing when you close your eyes   (2) Stands 30 s and aligns with gravity   (1) stands < 30 s OR aligns with surface   (0) unable Score: 1  DYNAMIC GAIT 10. CHANGE IN GAIT SPEED: begin walking at your normal speed, when I tell you fast, walk as fast as you can, when I say slow walk very slowly   (2) significantly changes walking speed without imbalance   (1) unable to change walking speed or signs of imbalance   (0) unable to achieve significant change in walking speed AND signes of imbalance Score:  2  11. WALK WITH HEAD TURNS HORIZONTAL: begin walking at normal speed, when I say right, turn your head to the right, when I say left turn your head to the left.  Try to keep yourself walking in a straight line   (2) performs head turns with no change in gait speed and good balance   (1) performs head turns with reduction in gait speed   (0)  performs head turns with imbalance  Score: 2  12. WALK WITH PIVOT TURNS: begin walking at your normal speed.  When I tell you to turn and stop, turn as quickly as you can, face the opposite direction and stop.  After the turn, your feet should be close together   (2) turns with feet close FAST in 3 steps with good balance   (1) turns with feet close SLOW > 4 steps with good balance   (0) cannot turn with feet close at any speed without imbalance Score: 2  13. STEP OVER OBSTACLES:  ( 9 inch height 10 feet away from subject) begin walking at your normal speed.  When you get to the box, step over it, not around it and keep walking   (2) Able to step over box with minimal change of gait speed and good balance   (1) Steps over box but touches box OR displays cautious behavior by slowing gait   (0) Unable to step over box OR steps around box Score: 2  14. TIMED UP AND GO NORMAL AND WITH DUAL TASK: 3 METER WALK: When I say go walk at your  your normal speed across the tape, turn around and come back to sit in the chair   Time: Count backwards by 3s starting at    20.  When I say go, stand up from the chair, walk at your normal speed across the tape on the floor, turn around, come back to sit in the chair.  Continue counting backwards the entire time.  Time:   (2) No noticeable change in sitting, standing or walking while backward counting when compared to TUG without dual task   (1) Dual task affects either counting OR walking (>10%)    (0) stops counting while walking OR stops walking while counting If gait speed slows more than 10% between TUG without and with dual task, the score should be decreased by a point Score:  not performed on eval  Total score:        17   /26 (# 14 not performed)                                                                                                                                 TREATMENT DATE:  03/10/24 Discussion of new diagnosis and modifications for fracture healing  Seated red loop hip abduction 10x bil, single leg up and over yoga block right/left 10x each Sit to stand from high mat table, left foot on yoga block to avoid left UE use 2 x 5  Standing in staggered stance with left leg back (no weight bearing) pink power cord under foot 10x Standing purple heavy power cord TKEs 5 sec hold 11x Seated purple heavy power cord single leg press 11x Seated HS curls red power cord 11x Standing holding back of the chair: left 3# ankle weight with hip abduction, hip extension, hip flexion 11x each 4 inch lateral step ups  with left leg abduction 3# 11x 4 inch forward step ups with left leg hip flexion 3# 11x WB on right with left circle taps multidirection to challenge dynamic balance  02/28/24 Discussion of new diagnosis and modifications for fracture healing  Seated yellow loop hip abduction 15x right/left  Sit to stand from high mat table, left foot on yoga block to avoid left UE  use Standing in staggered stance with left leg back (no weight bearing) hip hinge 10x, added red power cord under foot 10x Standing purple heavy power cord TKEs 5 sec hold 12x Seated green medium power cord single leg press 20x Seated HS curls red power cord 10x Standing leaning on tall mat table: left only hip extension 10x, left only hip abduction 10x Standing holding chair for balance WB on right LE  single leg RDL 10x to touch stool 10x       02/03/24 Recumbent bike L3 x 4' PT present to discuss status Seated HS stretch on Rt with application of pressure to distal thigh for knee extension stretch 2x30 Sit to stand from chair with mirror for biofeedback for knee valgus control, slow motion eccentric stand to sit with VC for eyes on knees Seated clam with yellow loop band x20 then more reps of sit to stand with improved control of knees bil At counter: blue loop x10 each Rt hip abd and ext, then no loop plank on elbows on counter with Rt LE donkey kick to ceiling for glut isolation with PT tapping glut max TKE with thin red power cord and 2 lateral step up holding on single vertical upright x15 for quad and hip control Deadlift holding 5lb x8 Bwd resistance walking x5 with 10lb for posterior chain strength and righting mechanism to prevent forward fall, eccentric quads on walk back in Standing with back to corner eyes closed feet together 2x20 Standing in stagger stance back to corner with trunk perturbations by PT in both foot positions Standing in square stance with trunk perturbations by PT Single LE stance on Rt starting  Seated fig 4 Rt x1 while discussing HEP during treavels HEP - keep it simple for now b/c Pt traveling - gave yellow tied band for standing counter hip abd and ext, Pt will keep working on back to corner balance with eyes closed and SLS at counter with min single hand/fingers to maintain control   01/22/24 evaluation  Plan of care: quad strengthening, balance  (single leg, sensory orientation and reactive postural control balance) Discussed practicing SLS next to kitchen counter Discussed eyes closed practice in the corner (back to the corner)   PATIENT EDUCATION:  Education details: Educated patient on anatomy and physiology of current symptoms, prognosis, plan of care as well as initial self care strategies to promote recovery Person educated: Patient Education method: Explanation Education comprehension: verbalized understanding  HOME EXERCISE PROGRAM: Access Code: [email or web address] URL: [email or web address] Date: 02/03/2024 Prepared by: Orvil Beuhring  Exercises - Standing Hip Abduction with Resistance at Ankles and Counter Support  - 2 x daily - 7 x weekly - 1 sets - 10 reps - Standing Hip Extension with Resistance at Ankles and Counter Support  - 2 x daily - 7 x weekly - 1 sets - 10 reps  ASSESSMENT:  CLINICAL IMPRESSION: Exercises continue to be modified secondary to recent patellar fracture.  Avoided weight bearing on left and focused on single balance and strengthening on right side.  Verbal cues to reduction of UE support and for intermittent CGA for  safety with balance challenges.  Will continue with left LE modifications until cleared by ortho (mild edema and light bruising present today).     Eval: Patient is a 88 y.o. female who was seen today for physical therapy evaluation and treatment for right knee pain and balance impairment > 1 year s/p TKR.  [name]'s ROM is much improved, particularly with extension, since she finished PT in September. Current right knee ROM 5-137 degrees.   Strength is good overall although weakness in right glutes and quads affect her ability to rise from a chair without compensations (hip adduction/knee internal rotation) and rise from a kneeling position at church. Mini-BEST test indicates impairments noted with eyes closed, single leg standing and reactive postural control balance and therefore  at risk for falls.  Her pain is mild at the moment and mostly at night time.  She would benefit from PT to address these deficits needed for community mobility, upcoming travel and to decrease risk of falls.      OBJECTIVE IMPAIRMENTS: decreased activity tolerance, decreased balance, decreased mobility, difficulty walking, decreased ROM, decreased strength, increased edema, impaired perceived functional ability, impaired flexibility, and pain.   ACTIVITY LIMITATIONS: lifting, bending, standing, squatting, sleeping, stairs, transfers, bed mobility, and locomotion level   PARTICIPATION LIMITATIONS: shopping, community activity, and yard work   PERSONAL FACTORS: Good general health positively affect patient's functional outcome.    REHAB POTENTIAL: Good   CLINICAL DECISION MAKING: Stable/uncomplicated   EVALUATION COMPLEXITY: Low GOALS: Goals reviewed with patient? Yes  SHORT TERM GOALS: Target date: 02/19/2024   The patient will demonstrate knowledge of basic self care strategies and exercises to promote healing  Baseline: Goal status: met 8/19   2.  Able to single leg balance for 3-5 sec on right needed for ascending/descending curbs while traveling to [facility]  Baseline:  Goal status: met 8/19   3.  Patient will be able to rise from a standard height chair with knees in alignment/ without compensatory strategies in order to decrease pain in both knees Baseline:  Goal status: ongoing    LONG TERM GOALS: Target date: 03/18/2024   The patient will be independent in a safe self progression of a home exercise program to promote further recovery of function and relieve pain Baseline:  Goal status: INITIAL   2.  Patient will report a 50% improvement in rising from a kneeling position at church Baseline:  Goal status: INITIAL   3.  Improved balance with Mini-BEST balance score improved to at least 20 indicating a decreased risk of falls  Baseline:  Goal status: INITIAL   4.   Improved strength needed for ascending/descending steps while traveling  Baseline:  Goal status: INITIAL   5.  LE strength needed to meet her personal goal of running across the fairway  Baseline:  Goal status: INITIAL   PLAN:  PT FREQUENCY: 2x/week  PT DURATION: 8 weeks  PLANNED INTERVENTIONS: 02835- PT Re-evaluation, 97110-Therapeutic exercises, 97530- Therapeutic activity, 97112- Neuromuscular re-education, 97535- Self Care, 02859- Manual therapy, [phone]- Aquatic Therapy, [id number]- Electrical stimulation (unattended), [phone]- Electrical stimulation (manual), [id number]- Vasopneumatic device, Patient/Family education, Balance training, Stair training, Taping, Joint mobilization, Cryotherapy, and Moist heat  PLAN FOR NEXT SESSION: new left patellar fracture will avoid left knee ROM and strengthening until cleared by ortho 8/22; single leg balance; balance with perturbances; balance with eyes closed; 6 inch step downs for strength (pt not a fan of the leg press); standing at the sink with backwards mini lunge  Glade Pesa, PT 03/10/24 12:16 PM Phone: [phone] Fax: [phone]  Phone: [phone] Fax: [phone]

## 2024-03-12 ENCOUNTER — Ambulatory Visit: Admitting: Physical Therapy

## 2024-03-12 DIAGNOSIS — M6281 Muscle weakness (generalized): Secondary | ICD-10-CM | POA: Diagnosis not present

## 2024-03-12 DIAGNOSIS — R2689 Other abnormalities of gait and mobility: Secondary | ICD-10-CM

## 2024-03-12 DIAGNOSIS — G8929 Other chronic pain: Secondary | ICD-10-CM

## 2024-03-12 NOTE — Therapy (Signed)
 OUTPATIENT PHYSICAL THERAPY LOWER EXTREMITY TREATMENT NOTE   Patient Name: Misty Dodson MRN: [id number] DOB:1935-11-05, 88 y.o., female Today's Date: 03/12/2024  END OF SESSION:  PT End of Session - 03/12/24 0931     Visit Number 5    Date for PT Re-Evaluation 03/18/24    Authorization Type [employer] Medicare    Progress Note Due on Visit 10    PT Start Time [phone]    PT Stop Time 1010    PT Time Calculation (min) 39 min    Activity Tolerance Patient tolerated treatment well           Past Medical History:  Diagnosis Date   Anxiety    occasional panic attacks   Family history of anesthesia complication    son's heart stopped during knee repair age 35Yrs   GERD (gastroesophageal reflux disease)    Heart murmur    Hypertension    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   Past Surgical History:  Procedure Laterality Date   ABDOMINAL HYSTERECTOMY     ANTERIOR AND POSTERIOR REPAIR N/A 02/23/2014   Procedure: ANTERIOR (CYSTOCELE) AND POSTERIOR REPAIR (RECTOCELE);  Surgeon: Ezzie Buba, MD;  Location: [facility];  Service: Gynecology;  Laterality: N/A;   BACK SURGERY     several times   CYSTOSCOPY N/A 02/23/2014   Procedure: CYSTOSCOPY;  Surgeon: Glendia DELENA Elizabeth, MD;  Location: [facility];  Service: Urology;  Laterality: N/A;   FOOT SURGERY     PUBOVAGINAL SLING N/A 02/23/2014   Procedure: CARLOYN GLADE;  Surgeon: Glendia DELENA Elizabeth, MD;  Location: [facility];  Service: Urology;  Laterality: N/A;   TONSILLECTOMY     Patient Active Problem List   Diagnosis Date Noted   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  States  she drove herself today to therapy, states she was a little nervous but it was OK.   Eval: I think this (right) leg is longer and I was unsteady in April when I went to my 1 year ortho follow up from TKR.  I still do my exercises every day. I have pain at night every night.  Now the left knee has started to hurt.  Some near misses with falls but no real falls.  I walk the dog with no problem.  Difficulty from getting up from the kneeling position at church.    PERTINENT HISTORY:  right TKR  11/06/22 with rehab at [facility] HTN; GERD Right shoulder replacement Hard of hearing wears hearing aide PAIN:  Are you having pain? Yes NPRS scale: 0/10  Pain location: both knees Pain orientation: Right and Left  PAIN TYPE: throbbing Pain description: intermittent  Aggravating factors: night time, lying with knee bent Relieving factors: walking OK   PRECAUTIONS: Fall   WEIGHT BEARING RESTRICTIONS: No  FALLS:  Has patient fallen in last 6 months? No  LIVING ENVIRONMENT: Lives with: lives with their spouse Lives in: House/apartment Stairs: main living and bedroom downstairs; husband uses the space upstairs more than she does    OCCUPATION: retired  PLOF: Independent  PATIENT GOALS: be able to run across the fairway (fall forward); I haven't done stairs in a long  time   OBJECTIVE:  Note: Objective measures were completed at Evaluation unless otherwise noted.  DIAGNOSTIC FINDINGS: none at the 1 year TKR mark per pt report  PATIENT SURVEYS:  LEFS  Extreme difficulty/unable (0), Quite a bit of difficulty (1), Moderate difficulty (2), Little difficulty (3), No difficulty (4) Survey date:     7/2  Any of your usual work, housework or school activities   2. Usual hobbies, recreational or sporting activities   3. Getting into/out of the bath   4. Walking between rooms   5. Putting on socks/shoes   6. Squatting    7. Lifting an object, like a bag of groceries from the floor   8.  Performing light activities around your home   9. Performing heavy activities around your home   10. Getting into/out of a car   11. Walking 2 blocks   12. Walking 1 mile   13. Going up/down 10 stairs (1 flight)   14. Standing for 1 hour   15.  sitting for 1 hour   16. Running on even ground   17. Running on uneven ground   18. Making sharp turns while running fast   19. Hopping    20. Rolling over in bed   Score total:  70/80     COGNITION: Overall cognitive status: Within functional limits for tasks assessed     MUSCLE LENGTH: Good hamstring length bil  LOWER EXTREMITY ROM: Right: Seated knee extension lacks 8 degrees Right: Supine knee ROM 5-137 degrees  LOWER EXTREMITY MMT: Right quads 4/5:  with 6 inch step down test = genu valgus/hip adduction Right glutes 4/5   FUNCTIONAL TESTS:  Able to rise sit to stand without UE assist but with knees adducted/internally rotated (touching) To pick up a small object from the floor she uses mostly a hip hinge strategy and less knee flexion SLS: left 3 sec, right 0 sec with pelvic drop  GAIT:  Good gait speed; no assistive device needed   MINI-BESTest: Balance Evaluation Systems Test ANTICIPATORY:   SIT TO STAND: (2) normal without use of hands and stabilizes independently     (1) Moderate comes to stand WITH use of hands on 1st attempt    (0) Severe: unable to stand up from chair without assistance OR needs several attempts with use of   hands Score: 2  2. RISE TO TOES: feet shoulder width apart. Hands on hips.  Rise as high as you can onto your toes. Try to hold this pose for 3 sec                          (2) stable for 3s with maximum height    (1) heels up, but not full range (smaller than when holding hands) OR noticeable instability for 3 sec                           (0) < or equat to 3 s  Score: 2  3. STAND ON ONE LEG: look straight ahead. Hands on hips. Lift your leg off the ground.     Left:  trial 1 __0__ trial  2____                                    Right: Trial 1:__0___   Trial 2:_______   (2)  20 s       (2)  20 s                (1) < 20      (1) < 20 s   (0) Unable      (0) unable Score (use the worst side):  0  REACTIVE POSTURAL CONTROL 4. COMPENSATORY STEPPING CORRECTION FORWARD stand with feet shoulder width apart, arms at your sides. Lean forward against my hands beyond your forward limits.  When I let go, do whatever is necessary, including taking a step, to avoid a fall   (2) recovers independently  with a single, large step, to avoid a fall   (1) more than one step used to recover equilibrium   (0) No step OR would fall if not caught Score: 0  5. COMPENSATORY STEPPING CORRECTION BACKWARD: Lean backward against my hands beyond your backward limits.  When I let go, do whatever is necessary, including a step to avoid a fall. (2) recovers independently  with a single, large step, to avoid a fall   (1) more than one step used to recover equilibrium   (0) No step OR would fall if not caught Score: 0  6. COMPENSATORY STEPPING LATERAL: Lean into my hand beyond your sideways limit.  When I let go, do whatever is necessary, including taking a step, to avoid a fall. Left           Right   (2) recovers independently with 1 step (crossover or lateral OK)   (1) several steps to recover equilibrium   (0) falls or cannot step Score:   2  (use the side with the lowest score)  SENSORY ORIENTATION 7. STANCE FEET TOGETHER EYES OPEN  firm surface Be as stable and still as possible until I say stop   (2) 30s   (1) < 30 s   (0) unable Score: 2  8. STANCE ON FOAM EYES CLOSED feet together, eyes closed, hands on hips. Be as stable and still as you can until I stay stop.  I will start timing when you close your eyes. Time in seconds:      (2) 30s   (1) <30 s   (0) unable Score: 0  9. INCLINE EYES CLOSED: Stand on incline with feet shoulder width apart, arms down at your sides.  I will start timing  when you close your eyes   (2) Stands 30 s and aligns with gravity   (1) stands < 30 s OR aligns with surface   (0) unable Score: 1  DYNAMIC GAIT 10. CHANGE IN GAIT SPEED: begin walking at your normal speed, when I tell you fast, walk as fast as you can, when I say slow walk very slowly   (2) significantly changes walking speed without imbalance   (1) unable to change walking speed or signs of imbalance   (0) unable to achieve significant change in walking speed AND signes of imbalance Score:  2  11. WALK WITH HEAD TURNS HORIZONTAL: begin walking at normal speed, when I say right, turn your head to the right, when I say left turn your head to the left.  Try to keep yourself walking in a straight line   (2) performs head turns with no change in gait speed and good balance   (1) performs head turns with reduction in gait speed   (0)  performs head turns with imbalance Score: 2  12. WALK WITH PIVOT TURNS: begin walking  at your normal speed.  When I tell you to turn and stop, turn as quickly as you can, face the opposite direction and stop.  After the turn, your feet should be close together   (2) turns with feet close FAST in 3 steps with good balance   (1) turns with feet close SLOW > 4 steps with good balance   (0) cannot turn with feet close at any speed without imbalance Score: 2  13. STEP OVER OBSTACLES:  ( 9 inch height 10 feet away from subject) begin walking at your normal speed.  When you get to the box, step over it, not around it and keep walking   (2) Able to step over box with minimal change of gait speed and good balance   (1) Steps over box but touches box OR displays cautious behavior by slowing gait   (0) Unable to step over box OR steps around box Score: 2  14. TIMED UP AND GO NORMAL AND WITH DUAL TASK: 3 METER WALK: When I say go walk at your your normal speed across the tape, turn around and come back to sit in the chair   Time: Count backwards by 3s starting  at    20.  When I say go, stand up from the chair, walk at your normal speed across the tape on the floor, turn around, come back to sit in the chair.  Continue counting backwards the entire time.  Time:   (2) No noticeable change in sitting, standing or walking while backward counting when compared to TUG without dual task   (1) Dual task affects either counting OR walking (>10%)    (0) stops counting while walking OR stops walking while counting If gait speed slows more than 10% between TUG without and with dual task, the score should be decreased by a point Score:  not performed on eval  Total score:        17   /26 (# 14 not performed)                                                                                                                                 TREATMENT DATE:  03/12/24 Right 6 inch forward step ups 10x Right 6 inch  lateral step ups 10x WB on right step downs 4 inch heel touch and back toe touch 10x Dynamic balance: circle touches with gradual reduction in UE support until no support last 2 rounds Sit to stand with left foot on box (to limit WB on left LE) 2 x5 WB on right modified RDL 5# to tall cone touch 89k (needs 2 finger UE support on barre)  WB on right, with left foot prop on 6 inch step with head turns, looking up while holding 5# weight unilaterally WB on right with left foot prop on step with 5# weight diagonals 10x right/left Hip matrix 40# left only (WB on right): abduction,  extension and flexion 10x each  03/10/24 Discussion of new diagnosis and modifications for fracture healing  Seated red loop hip abduction 10x bil, single leg up and over yoga block right/left 10x each Sit to stand from high mat table, left foot on yoga block to avoid left UE use 2 x 5  Standing in staggered stance with left leg back (no weight bearing) pink power cord under foot 10x Standing purple heavy power cord TKEs 5 sec hold 11x Seated purple heavy power cord single leg press  11x Seated HS curls red power cord 11x Standing holding back of the chair: left 3# ankle weight with hip abduction, hip extension, hip flexion 11x each 4 inch lateral step ups with left leg abduction 3# 11x 4 inch forward step ups with left leg hip flexion 3# 11x WB on right with left circle taps multidirection to challenge dynamic balance  02/28/24 Discussion of new diagnosis and modifications for fracture healing  Seated yellow loop hip abduction 15x right/left  Sit to stand from high mat table, left foot on yoga block to avoid left UE use Standing in staggered stance with left leg back (no weight bearing) hip hinge 10x, added red power cord under foot 10x Standing purple heavy power cord TKEs 5 sec hold 12x Seated green medium power cord single leg press 20x Seated HS curls red power cord 10x Standing leaning on tall mat table: left only hip extension 10x, left only hip abduction 10x Standing holding chair for balance WB on right LE  single leg RDL 10x to touch stool 10x    02/03/24 Recumbent bike L3 x 4' PT present to discuss status Seated HS stretch on Rt with application of pressure to distal thigh for knee extension stretch 2x30 Sit to stand from chair with mirror for biofeedback for knee valgus control, slow motion eccentric stand to sit with VC for eyes on knees Seated clam with yellow loop band x20 then more reps of sit to stand with improved control of knees bil At counter: blue loop x10 each Rt hip abd and ext, then no loop plank on elbows on counter with Rt LE donkey kick to ceiling for glut isolation with PT tapping glut max TKE with thin red power cord and 2 lateral step up holding on single vertical upright x15 for quad and hip control Deadlift holding 5lb x8 Bwd resistance walking x5 with 10lb for posterior chain strength and righting mechanism to prevent forward fall, eccentric quads on walk back in Standing with back to corner eyes closed feet together 2x20 Standing  in stagger stance back to corner with trunk perturbations by PT in both foot positions Standing in square stance with trunk perturbations by PT Single LE stance on Rt starting  Seated fig 4 Rt x1 while discussing HEP during [place] HEP - keep it simple for now b/c Pt traveling - gave yellow tied band for standing counter hip abd and ext, Pt will keep working on back to corner balance with eyes closed and SLS at counter with min single hand/fingers to maintain control    PATIENT EDUCATION:  Education details: Educated patient on anatomy and physiology of current symptoms, prognosis, plan of care as well as initial self care strategies to promote recovery Person educated: Patient Education method: Explanation Education comprehension: verbalized understanding  HOME EXERCISE PROGRAM: Access Code: [email or web address] URL: [email or web address] Date: 02/03/2024 Prepared by: Orvil Beuhring  Exercises - Standing Hip Abduction with Resistance at Ankles and Counter Support  - 2  x daily - 7 x weekly - 1 sets - 10 reps - Standing Hip Extension with Resistance at Ankles and Counter Support  - 2 x daily - 7 x weekly - 1 sets - 10 reps  ASSESSMENT:  CLINICAL IMPRESSION: Modifications continued secondary to recent left patellar fracture with limited weight bearing on left LE until she follows up with ortho tomorrow. Dynamic balance ex's progressed with verbal cues for reduction in UE reliance while performing multi directional UE and LE reaching.  No loss of balance although close supervision provided for safety.  Improving right LE strength and muscular endurance noted with single leg standing exercises.    Eval: Patient is a 88 y.o. female who was seen today for physical therapy evaluation and treatment for right knee pain and balance impairment > 1 year s/p TKR.  [name]'s ROM is much improved, particularly with extension, since she finished PT in September. Current right knee ROM 5-137 degrees.    Strength is good overall although weakness in right glutes and quads affect her ability to rise from a chair without compensations (hip adduction/knee internal rotation) and rise from a kneeling position at church. Mini-BEST test indicates impairments noted with eyes closed, single leg standing and reactive postural control balance and therefore at risk for falls.  Her pain is mild at the moment and mostly at night time.  She would benefit from PT to address these deficits needed for community mobility, upcoming travel and to decrease risk of falls.      OBJECTIVE IMPAIRMENTS: decreased activity tolerance, decreased balance, decreased mobility, difficulty walking, decreased ROM, decreased strength, increased edema, impaired perceived functional ability, impaired flexibility, and pain.   ACTIVITY LIMITATIONS: lifting, bending, standing, squatting, sleeping, stairs, transfers, bed mobility, and locomotion level   PARTICIPATION LIMITATIONS: shopping, community activity, and yard work   PERSONAL FACTORS: Good general health positively affect patient's functional outcome.    REHAB POTENTIAL: Good   CLINICAL DECISION MAKING: Stable/uncomplicated   EVALUATION COMPLEXITY: Low GOALS: Goals reviewed with patient? Yes  SHORT TERM GOALS: Target date: 02/19/2024   The patient will demonstrate knowledge of basic self care strategies and exercises to promote healing  Baseline: Goal status: met 8/19   2.  Able to single leg balance for 3-5 sec on right needed for ascending/descending curbs while traveling to [facility]  Baseline:  Goal status: met 8/19   3.  Patient will be able to rise from a standard height chair with knees in alignment/ without compensatory strategies in order to decrease pain in both knees Baseline:  Goal status: ongoing    LONG TERM GOALS: Target date: 03/18/2024   The patient will be independent in a safe self progression of a home exercise program to promote further  recovery of function and relieve pain Baseline:  Goal status: INITIAL   2.  Patient will report a 50% improvement in rising from a kneeling position at church Baseline:  Goal status: INITIAL   3.  Improved balance with Mini-BEST balance score improved to at least 20 indicating a decreased risk of falls  Baseline:  Goal status: INITIAL   4.  Improved strength needed for ascending/descending steps while traveling  Baseline:  Goal status: INITIAL   5.  LE strength needed to meet her personal goal of running across the fairway  Baseline:  Goal status: INITIAL   PLAN:  PT FREQUENCY: 2x/week  PT DURATION: 8 weeks  PLANNED INTERVENTIONS: 97164- PT Re-evaluation, 97110-Therapeutic exercises, 97530- Therapeutic activity, 97112- Neuromuscular re-education,  02464- Self Care, [id number]- Manual therapy, [phone]- Aquatic Therapy, [phone]- Electrical stimulation (unattended), [phone]- Electrical stimulation (manual), [phone]- Vasopneumatic device, Patient/Family education, Balance training, Stair training, Taping, Joint mobilization, Cryotherapy, and Moist heat  PLAN FOR NEXT SESSION: new left patellar fracture will avoid left knee ROM and strengthening until cleared by ortho 8/22; single leg balance; balance with perturbances; balance with eyes closed; 6 inch step downs for strength (pt not a fan of the leg press); standing at the sink with backwards mini lunge    Glade Pesa, PT 03/12/24 11:21 AM Phone: [phone] Fax: [phone]

## 2024-03-17 ENCOUNTER — Ambulatory Visit: Admitting: Physical Therapy

## 2024-03-17 DIAGNOSIS — G8929 Other chronic pain: Secondary | ICD-10-CM

## 2024-03-17 DIAGNOSIS — R2689 Other abnormalities of gait and mobility: Secondary | ICD-10-CM

## 2024-03-17 DIAGNOSIS — M6281 Muscle weakness (generalized): Secondary | ICD-10-CM | POA: Diagnosis not present

## 2024-03-17 NOTE — Therapy (Signed)
 OUTPATIENT PHYSICAL THERAPY LOWER EXTREMITY TREATMENT NOTE   Patient Name: Misty Dodson MRN: [id number] DOB:[birth year], 88 y.o., female Today's Date: 03/17/2024  END OF SESSION:  PT End of Session - 03/17/24 0937     Visit Number 6    Date for PT Re-Evaluation 03/18/24    Authorization Type [employer] Medicare    Progress Note Due on Visit 10    PT Start Time 0935    PT Stop Time 1015    PT Time Calculation (min) 40 min    Activity Tolerance Patient tolerated treatment well           Past Medical History:  Diagnosis Date   Anxiety    occasional panic attacks   Family history of anesthesia complication    son's heart stopped during knee repair age 14Yrs   GERD (gastroesophageal reflux disease)    Heart murmur    Hypertension    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   Past Surgical History:  Procedure Laterality Date   ABDOMINAL HYSTERECTOMY     ANTERIOR AND POSTERIOR REPAIR N/A 02/23/2014   Procedure: ANTERIOR (CYSTOCELE) AND POSTERIOR REPAIR (RECTOCELE);  Surgeon: Ezzie Buba, MD;  Location: [facility];  Service: Gynecology;  Laterality: N/A;   BACK SURGERY     several times   CYSTOSCOPY N/A 02/23/2014   Procedure: CYSTOSCOPY;  Surgeon: Glendia DELENA Elizabeth, MD;  Location: [facility];  Service: Urology;  Laterality: N/A;   FOOT SURGERY     PUBOVAGINAL SLING N/A 02/23/2014   Procedure: CARLOYN GLADE;  Surgeon: Glendia DELENA Elizabeth, MD;  Location: [facility];  Service: Urology;  Laterality: N/A;   TONSILLECTOMY     Patient Active Problem List   Diagnosis Date Noted   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  Rescheduled for 9/19.    Eval: I think this (right) leg is longer and I was unsteady in April when I went to my 1 year ortho follow up from TKR.  I still do my exercises every day. I have pain at night every night.  Now the left knee has started to hurt.  Some near misses with falls but no real falls.  I walk the dog with no problem.  Difficulty from getting up from the kneeling position at church.    PERTINENT HISTORY:  right TKR  11/06/22 with rehab at [facility] HTN; GERD Right shoulder replacement Hard of hearing wears hearing aide PAIN:  Are you having pain? Yes NPRS scale: 2/10 right knee; 0/10 left knee Pain location: both knees Pain orientation: Right and Left  PAIN TYPE: throbbing Pain description: intermittent  Aggravating factors: night time, lying with knee bent Relieving factors: walking OK   PRECAUTIONS: Fall   WEIGHT BEARING RESTRICTIONS: No  FALLS:  Has patient fallen in last 6 months? No  LIVING ENVIRONMENT: Lives with: lives with their spouse Lives in: House/apartment Stairs: main living and bedroom downstairs; husband uses the space upstairs more than she does    OCCUPATION: retired  PLOF: Independent  PATIENT GOALS: be able to run across the fairway (fall forward); I haven't done stairs in  a long time   OBJECTIVE:  Note: Objective measures were completed at Evaluation unless otherwise noted.  DIAGNOSTIC FINDINGS: none at the 1 year TKR mark per pt report  PATIENT SURVEYS:  LEFS  Extreme difficulty/unable (0), Quite a bit of difficulty (1), Moderate difficulty (2), Little difficulty (3), No difficulty (4) Survey date:     7/2  Any of your usual work, housework or school activities   2. Usual hobbies, recreational or sporting activities   3. Getting into/out of the bath   4. Walking between rooms   5. Putting on socks/shoes   6. Squatting    7. Lifting an object, like a bag of groceries from the floor    8. Performing light activities around your home   9. Performing heavy activities around your home   10. Getting into/out of a car   11. Walking 2 blocks   12. Walking 1 mile   13. Going up/down 10 stairs (1 flight)   14. Standing for 1 hour   15.  sitting for 1 hour   16. Running on even ground   17. Running on uneven ground   18. Making sharp turns while running fast   19. Hopping    20. Rolling over in bed   Score total:  70/80     COGNITION: Overall cognitive status: Within functional limits for tasks assessed     MUSCLE LENGTH: Good hamstring length bil  LOWER EXTREMITY ROM: Right: Seated knee extension lacks 8 degrees Right: Supine knee ROM 5-137 degrees  LOWER EXTREMITY MMT: Right quads 4/5:  with 6 inch step down test = genu valgus/hip adduction Right glutes 4/5   FUNCTIONAL TESTS:  Able to rise sit to stand without UE assist but with knees adducted/internally rotated (touching) To pick up a small object from the floor she uses mostly a hip hinge strategy and less knee flexion SLS: left 3 sec, right 0 sec with pelvic drop  GAIT:  Good gait speed; no assistive device needed   MINI-BESTest: Balance Evaluation Systems Test ANTICIPATORY:   SIT TO STAND: (2) normal without use of hands and stabilizes independently     (1) Moderate comes to stand WITH use of hands on 1st attempt    (0) Severe: unable to stand up from chair without assistance OR needs several attempts with use of   hands Score: 2  2. RISE TO TOES: feet shoulder width apart. Hands on hips.  Rise as high as you can onto your toes. Try to hold this pose for 3 sec                          (2) stable for 3s with maximum height    (1) heels up, but not full range (smaller than when holding hands) OR noticeable instability for 3 sec                           (0) < or equat to 3 s  Score: 2  3. STAND ON ONE LEG: look straight ahead. Hands on hips. Lift your leg off the ground.     Left:  trial 1 __0__  trial 2____                                    Right: Trial 1:__0___   Trial 2:_______   (  2) 20 s       (2)  20 s                (1) < 20      (1) < 20 s   (0) Unable      (0) unable Score (use the worst side):  0  REACTIVE POSTURAL CONTROL 4. COMPENSATORY STEPPING CORRECTION FORWARD stand with feet shoulder width apart, arms at your sides. Lean forward against my hands beyond your forward limits.  When I let go, do whatever is necessary, including taking a step, to avoid a fall   (2) recovers independently  with a single, large step, to avoid a fall   (1) more than one step used to recover equilibrium   (0) No step OR would fall if not caught Score: 0  5. COMPENSATORY STEPPING CORRECTION BACKWARD: Lean backward against my hands beyond your backward limits.  When I let go, do whatever is necessary, including a step to avoid a fall. (2) recovers independently  with a single, large step, to avoid a fall   (1) more than one step used to recover equilibrium   (0) No step OR would fall if not caught Score: 0  6. COMPENSATORY STEPPING LATERAL: Lean into my hand beyond your sideways limit.  When I let go, do whatever is necessary, including taking a step, to avoid a fall. Left           Right   (2) recovers independently with 1 step (crossover or lateral OK)   (1) several steps to recover equilibrium   (0) falls or cannot step Score:   2  (use the side with the lowest score)  SENSORY ORIENTATION 7. STANCE FEET TOGETHER EYES OPEN  firm surface Be as stable and still as possible until I say stop   (2) 30s   (1) < 30 s   (0) unable Score: 2  8. STANCE ON FOAM EYES CLOSED feet together, eyes closed, hands on hips. Be as stable and still as you can until I stay stop.  I will start timing when you close your eyes. Time in seconds:      (2) 30s   (1) <30 s   (0) unable Score: 0  9. INCLINE EYES CLOSED: Stand on incline with feet shoulder width apart, arms down at your sides.  I will start  timing when you close your eyes   (2) Stands 30 s and aligns with gravity   (1) stands < 30 s OR aligns with surface   (0) unable Score: 1  DYNAMIC GAIT 10. CHANGE IN GAIT SPEED: begin walking at your normal speed, when I tell you fast, walk as fast as you can, when I say slow walk very slowly   (2) significantly changes walking speed without imbalance   (1) unable to change walking speed or signs of imbalance   (0) unable to achieve significant change in walking speed AND signes of imbalance Score:  2  11. WALK WITH HEAD TURNS HORIZONTAL: begin walking at normal speed, when I say right, turn your head to the right, when I say left turn your head to the left.  Try to keep yourself walking in a straight line   (2) performs head turns with no change in gait speed and good balance   (1) performs head turns with reduction in gait speed   (0)  performs head turns with imbalance Score: 2  12. WALK WITH PIVOT TURNS: begin  walking at your normal speed.  When I tell you to turn and stop, turn as quickly as you can, face the opposite direction and stop.  After the turn, your feet should be close together   (2) turns with feet close FAST in 3 steps with good balance   (1) turns with feet close SLOW > 4 steps with good balance   (0) cannot turn with feet close at any speed without imbalance Score: 2  13. STEP OVER OBSTACLES:  ( 9 inch height 10 feet away from subject) begin walking at your normal speed.  When you get to the box, step over it, not around it and keep walking   (2) Able to step over box with minimal change of gait speed and good balance   (1) Steps over box but touches box OR displays cautious behavior by slowing gait   (0) Unable to step over box OR steps around box Score: 2  14. TIMED UP AND GO NORMAL AND WITH DUAL TASK: 3 METER WALK: When I say go walk at your your normal speed across the tape, turn around and come back to sit in the chair   Time: Count backwards by 3s  starting at    20.  When I say go, stand up from the chair, walk at your normal speed across the tape on the floor, turn around, come back to sit in the chair.  Continue counting backwards the entire time.  Time:   (2) No noticeable change in sitting, standing or walking while backward counting when compared to TUG without dual task   (1) Dual task affects either counting OR walking (>10%)    (0) stops counting while walking OR stops walking while counting If gait speed slows more than 10% between TUG without and with dual task, the score should be decreased by a point Score:  not performed on eval  Total score:        17   /26 (# 14 not performed)                                                                                                                                 TREATMENT DATE:  03/17/24 Sit to stand in staggered stance 5x no hands from high table Right 6 inch forward step ups with left hip flexion 10x (needs moderate single UE support) Right 6 inch  lateral step ups with left hip abduction (needs 1 finger UE support on barre) 10x Foam cushion WB on right with left touch forward and back 10x needs 1 finger support 10x Foam cushion WB on right with lateral movement left toe  touch  1 finger support 10x WB on right with left foot on 8 inch box: holding a 6# dumbbell with trunk rotation 10x, overhead reach 10x WB on right modified RDL 6# to just above 8 inch box 10x (needs 2 finger UE support on barre)  Hip  matrix 40# : abduction, extension and flexion 10x each right/left Therapist called Dr. Jeraline office at 1:45 to clarify status of left knee following patellar fracture and request PT referral for left knee (left message with receptionist)  03/12/24 Right 6 inch forward step ups 10x Right 6 inch  lateral step ups 10x WB on right step downs 4 inch heel touch and back toe touch 10x Dynamic balance: circle touches with gradual reduction in UE support until no support last 2  rounds Sit to stand with left foot on box (to limit WB on left LE) 2 x5 WB on right modified RDL 5# to tall cone touch 89k (needs 2 finger UE support on barre)  WB on right, with left foot prop on 6 inch step with head turns, looking up while holding 5# weight unilaterally WB on right with left foot prop on step with 5# weight diagonals 10x right/left Hip matrix 40# left only (WB on right): abduction, extension and flexion 10x each  03/10/24 Discussion of new diagnosis and modifications for fracture healing  Seated red loop hip abduction 10x bil, single leg up and over yoga block right/left 10x each Sit to stand from high mat table, left foot on yoga block to avoid left UE use 2 x 5  Standing in staggered stance with left leg back (no weight bearing) pink power cord under foot 10x Standing purple heavy power cord TKEs 5 sec hold 11x Seated purple heavy power cord single leg press 11x Seated HS curls red power cord 11x Standing holding back of the chair: left 3# ankle weight with hip abduction, hip extension, hip flexion 11x each 4 inch lateral step ups with left leg abduction 3# 11x 4 inch forward step ups with left leg hip flexion 3# 11x WB on right with left circle taps multidirection to challenge dynamic balance  02/28/24 Discussion of new diagnosis and modifications for fracture healing  Seated yellow loop hip abduction 15x right/left  Sit to stand from high mat table, left foot on yoga block to avoid left UE use Standing in staggered stance with left leg back (no weight bearing) hip hinge 10x, added red power cord under foot 10x Standing purple heavy power cord TKEs 5 sec hold 12x Seated green medium power cord single leg press 20x Seated HS curls red power cord 10x Standing leaning on tall mat table: left only hip extension 10x, left only hip abduction 10x Standing holding chair for balance WB on right LE  single leg RDL 10x to touch stool 10x     PATIENT EDUCATION:  Education  details: Educated patient on anatomy and physiology of current symptoms, prognosis, plan of care as well as initial self care strategies to promote recovery Person educated: Patient Education method: Explanation Education comprehension: verbalized understanding  HOME EXERCISE PROGRAM: Access Code: [email or web address] URL: [email or web address] Date: 02/03/2024 Prepared by: Orvil Beuhring  Exercises - Standing Hip Abduction with Resistance at Ankles and Counter Support  - 2 x daily - 7 x weekly - 1 sets - 10 reps - Standing Hip Extension with Resistance at Ankles and Counter Support  - 2 x daily - 7 x weekly - 1 sets - 10 reps  ASSESSMENT:  CLINICAL IMPRESSION: [name]'s follow up with the orthopedist regarding left patellar fracture was cancelled.  Therapist left message with the ortho office to request PT referral to include left knee to plan of care. In the meantime therapist continues to modify ex's to avoid loading left LE until cleared by  ortho.  Decreased UE support needed with right single leg standing indicating improving balance and strength.  Balance goals may not be met secondary to left knee injury.    Eval: Patient is a 88 y.o. female who was seen today for physical therapy evaluation and treatment for right knee pain and balance impairment > 1 year s/p TKR.  [name]'s ROM is much improved, particularly with extension, since she finished PT in September. Current right knee ROM 5-137 degrees.   Strength is good overall although weakness in right glutes and quads affect her ability to rise from a chair without compensations (hip adduction/knee internal rotation) and rise from a kneeling position at church. Mini-BEST test indicates impairments noted with eyes closed, single leg standing and reactive postural control balance and therefore at risk for falls.  Her pain is mild at the moment and mostly at night time.  She would benefit from PT to address these deficits needed for community  mobility, upcoming travel and to decrease risk of falls.      OBJECTIVE IMPAIRMENTS: decreased activity tolerance, decreased balance, decreased mobility, difficulty walking, decreased ROM, decreased strength, increased edema, impaired perceived functional ability, impaired flexibility, and pain.   ACTIVITY LIMITATIONS: lifting, bending, standing, squatting, sleeping, stairs, transfers, bed mobility, and locomotion level   PARTICIPATION LIMITATIONS: shopping, community activity, and yard work   PERSONAL FACTORS: Good general health positively affect patient's functional outcome.    REHAB POTENTIAL: Good   CLINICAL DECISION MAKING: Stable/uncomplicated   EVALUATION COMPLEXITY: Low GOALS: Goals reviewed with patient? Yes  SHORT TERM GOALS: Target date: 02/19/2024   The patient will demonstrate knowledge of basic self care strategies and exercises to promote healing  Baseline: Goal status: met 8/19   2.  Able to single leg balance for 3-5 sec on right needed for ascending/descending curbs while traveling to [facility]  Baseline:  Goal status: met 8/19   3.  Patient will be able to rise from a standard height chair with knees in alignment/ without compensatory strategies in order to decrease pain in both knees Baseline:  Goal status: ongoing    LONG TERM GOALS: Target date: 03/18/2024   The patient will be independent in a safe self progression of a home exercise program to promote further recovery of function and relieve pain Baseline:  Goal status: INITIAL   2.  Patient will report a 50% improvement in rising from a kneeling position at church Baseline:  Goal status: INITIAL   3.  Improved balance with Mini-BEST balance score improved to at least 20 indicating a decreased risk of falls  Baseline:  Goal status: INITIAL   4.  Improved strength needed for ascending/descending steps while traveling  Baseline:  Goal status: INITIAL   5.  LE strength needed to meet her  personal goal of running across the fairway  Baseline:  Goal status: INITIAL   PLAN:  PT FREQUENCY: 2x/week  PT DURATION: 8 weeks  PLANNED INTERVENTIONS: 02835- PT Re-evaluation, 97110-Therapeutic exercises, 97530- Therapeutic activity, 97112- Neuromuscular re-education, 97535- Self Care, 02859- Manual therapy, [phone]- Aquatic Therapy, [id number]- Electrical stimulation (unattended), [phone]- Electrical stimulation (manual), 97016- Vasopneumatic device, Patient/Family education, Balance training, Stair training, Taping, Joint mobilization, Cryotherapy, and Moist heat  PLAN FOR NEXT SESSION: ERO with potential addition of left knee pain (left patellar fracture) awaiting input from ortho; single leg balance; balance with perturbances; balance with eyes closed; 6 inch step downs for strength (pt not a fan of the leg press); standing at the sink with  backwards mini lunge    Glade Pesa, PT 03/17/24 9:38 AM Phone: [phone] Fax: [phone]

## 2024-03-19 ENCOUNTER — Telehealth: Payer: Self-pay | Admitting: Physical Therapy

## 2024-03-19 NOTE — Telephone Encounter (Signed)
 Spoke with pt due to the request of the PT seeing her 03/19/2024 to determine if she should continue PT knowing we do not yet have a referral to begin treatment on her other (left) knee. She is wanting treatment for her left knee which was fractured on 02/04/2024 but we do not have a referral for that. I explained to her that I have called her orthopedist and left a message asking if she could be seen sooner than 04/10/2024 for a reassessment by him or if he felt comfortable sending us  a referral to go ahead and treat the left knee. After talking with her at length about her right knee which we have been treating, she has decided to come to PT tomorrow to review her progress, review her HEP and be discharged for her right knee. She would like to wait to see her orthopedist and if he is agreeable to send her for her left knee, we will start over with a left knee eval. Pt was appreciative of this plan and will come for her last visit tomorrow and then return as needed for her left knee if he refers her. Eward Wonda Sharps, [place] 03/19/24 12:23 PM

## 2024-03-20 ENCOUNTER — Ambulatory Visit: Admitting: Physical Therapy

## 2024-03-20 ENCOUNTER — Encounter: Payer: Self-pay | Admitting: Physical Therapy

## 2024-03-20 DIAGNOSIS — R262 Difficulty in walking, not elsewhere classified: Secondary | ICD-10-CM

## 2024-03-20 DIAGNOSIS — M25661 Stiffness of right knee, not elsewhere classified: Secondary | ICD-10-CM

## 2024-03-20 DIAGNOSIS — M25561 Pain in right knee: Secondary | ICD-10-CM

## 2024-03-20 DIAGNOSIS — G8929 Other chronic pain: Secondary | ICD-10-CM

## 2024-03-20 DIAGNOSIS — R2689 Other abnormalities of gait and mobility: Secondary | ICD-10-CM

## 2024-03-20 DIAGNOSIS — M6281 Muscle weakness (generalized): Secondary | ICD-10-CM | POA: Diagnosis not present

## 2024-03-20 NOTE — Therapy (Signed)
 OUTPATIENT PHYSICAL THERAPY LOWER EXTREMITY TREATMENT NOTE   Patient Name: Misty Dodson MRN: [id number] DOB:[birth year], 88 y.o., female Today's Date: 03/20/2024  END OF SESSION:  PT End of Session - 03/20/24 0935     Visit Number 7    Date for PT Re-Evaluation 03/20/24    Authorization Type BCBS Medicare    Progress Note Due on Visit 10    PT Start Time 0935    PT Stop Time 1015    PT Time Calculation (min) 40 min    Activity Tolerance Patient tolerated treatment well    Behavior During Therapy [facility] for tasks assessed/performed            Past Medical History:  Diagnosis Date   Anxiety    occasional panic attacks   Family history of anesthesia complication    son's heart stopped during knee repair age 26Yrs   GERD (gastroesophageal reflux disease)    Heart murmur    Hypertension    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   Past Surgical History:  Procedure Laterality Date   ABDOMINAL HYSTERECTOMY     ANTERIOR AND POSTERIOR REPAIR N/A 02/23/2014   Procedure: ANTERIOR (CYSTOCELE) AND POSTERIOR REPAIR (RECTOCELE);  Surgeon: Ezzie Buba, MD;  Location: [facility];  Service: Gynecology;  Laterality: N/A;   BACK SURGERY     several times   CYSTOSCOPY N/A 02/23/2014   Procedure: CYSTOSCOPY;  Surgeon: Glendia DELENA Elizabeth, MD;  Location: [facility];  Service: Urology;  Laterality: N/A;   FOOT SURGERY     PUBOVAGINAL SLING N/A 02/23/2014   Procedure: CARLOYN GLADE;  Surgeon: Glendia DELENA Elizabeth, MD;  Location: [facility];  Service: Urology;  Laterality: N/A;   TONSILLECTOMY     Patient Active Problem List   Diagnosis Date Noted   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  Overall I feel like the Rt knee is 90% better.  It is a little stiff when I first get up in the morning.  I am not sure it is stronger but I have had to use it more given my Lt knee injury.  I walk in the AM on the days I don't have PT and I walk fast and it doesn't bother me.    Eval: I think this (right) leg is longer and I was unsteady in April when I went to my 1 year ortho follow up from TKR.  I still do my exercises every day. I have pain at night every night.  Now the left knee has started to hurt.  Some near misses with falls but no real falls.  I walk the dog with no problem.  Difficulty from getting up from the kneeling position at church.    PERTINENT HISTORY:  right TKR  11/06/22 with rehab at [facility] HTN; GERD Right shoulder replacement Hard of hearing wears hearing aide PAIN:  Are you having pain? Yes NPRS scale: 0/10 right knee currently, briefly 4-5/10 after I've been still and then need to move it but quickly resolves Pain location: both knees Pain orientation: Right and Left  PAIN TYPE: throbbing Pain description: intermittent  Aggravating factors: needing  to move Rt knee after being still but quickly improves with movement Relieving factors: movement, aspercreme  PRECAUTIONS: Fall   WEIGHT BEARING RESTRICTIONS: No  FALLS:  Has patient fallen in last 6 months? No  LIVING ENVIRONMENT: Lives with: lives with their spouse Lives in: House/apartment Stairs: main living and bedroom downstairs; husband uses the space upstairs more than she does    OCCUPATION: retired  PLOF: Independent  PATIENT GOALS: be able to run across the fairway (fall forward); I haven't done stairs in a long time   OBJECTIVE:  Note: Objective measures were completed at Evaluation unless otherwise noted.  DIAGNOSTIC FINDINGS: none at the 1 year TKR mark per pt report  PATIENT SURVEYS:  LEFS  Extreme  difficulty/unable (0), Quite a bit of difficulty (1), Moderate difficulty (2), Little difficulty (3), No difficulty (4) Survey date:     7/2 8/29  Any of your usual work, housework or school activities  4  2. Usual hobbies, recreational or sporting activities  4  3. Getting into/out of the bath  4  4. Walking between rooms  4  5. Putting on socks/shoes  4  6. Squatting   4  7. Lifting an object, like a bag of groceries from the floor  4  8. Performing light activities around your home  4  9. Performing heavy activities around your home  3  10. Getting into/out of a car  4  11. Walking 2 blocks  4  12. Walking 1 mile  4  13. Going up/down 10 stairs (1 flight)  3  14. Standing for 1 hour  3  15.  sitting for 1 hour  4  16. Running on even ground  4  17. Running on uneven ground  3  18. Making sharp turns while running fast  3  19. Hopping   4  20. Rolling over in bed  4  Score total:  70/80 67/80     COGNITION: Overall cognitive status: Within functional limits for tasks assessed     MUSCLE LENGTH: Good hamstring length bil  LOWER EXTREMITY ROM: 03/20/24 Right: knee ext in sitting lacks 3 degrees Right: knee flexion in supine: 137 deg  Eval: Right: Seated knee extension lacks 8 degrees Right: Supine knee ROM 5-137 degrees  LOWER EXTREMITY MMT: 03/20/24 Right: hip ER and abd 4/5, all other muscle groups of Rt knee and hip are 5/5  Eval: Right quads 4/5:  with 6 inch step down test = genu valgus/hip adduction Right glutes 4/5   FUNCTIONAL TESTS:  03/20/24: able to perform symmetrical and aligned sit to stand from square stance, in stagger stance with Rt foot back Pt leans Rt to stand SLS: able to stand in Rt SLS with single finger support but 0 sec without any UE support Continues to use hip hinge to reach for floor object  Eval: Able to rise sit to stand without UE assist but with knees adducted/internally rotated (touching) To pick up a small object from the floor  she uses mostly a hip hinge strategy and less knee flexion SLS: left 3 sec, right 0 sec with pelvic drop  GAIT:  Good gait speed; no assistive device needed   MINI-BESTest: Balance Evaluation Systems Test   ANTICIPATORY:   SIT TO STAND: (2) normal without use of hands and stabilizes independently     (1) Moderate comes to stand WITH use of hands on 1st attempt    (0) Severe: unable to stand up from chair  without assistance OR needs several attempts with use of   hands Score: 2  2. RISE TO TOES: feet shoulder width apart. Hands on hips.  Rise as high as you can onto your toes. Try to hold this pose for 3 sec                          (2) stable for 3s with maximum height    (1) heels up, but not full range (smaller than when holding hands) OR noticeable instability for 3 sec                           (0) < or equat to 3 s  Score: 2  3. STAND ON ONE LEG: look straight ahead. Hands on hips. Lift your leg off the ground.     Left:  trial 1 __0__ trial 2____                                    Right: Trial 1:__0___   Trial 2:_______   (2) 20 s       (2)  20 s                (1) < 20      (1) < 20 s   (0) Unable      (0) unable Score (use the worst side):  0  REACTIVE POSTURAL CONTROL 4. COMPENSATORY STEPPING CORRECTION FORWARD stand with feet shoulder width apart, arms at your sides. Lean forward against my hands beyond your forward limits.  When I let go, do whatever is necessary, including taking a step, to avoid a fall   (2) recovers independently  with a single, large step, to avoid a fall   (1) more than one step used to recover equilibrium   (0) No step OR would fall if not caught Score: 0, 1 on 03/20/24  5. COMPENSATORY STEPPING CORRECTION BACKWARD: Lean backward against my hands beyond your backward limits.  When I let go, do whatever is necessary, including a step to avoid a fall. (2) recovers independently  with a single, large step, to avoid a fall   (1) more than one step  used to recover equilibrium   (0) No step OR would fall if not caught Score: 0, 1 on 03/20/24  6. COMPENSATORY STEPPING LATERAL: Lean into my hand beyond your sideways limit.  When I let go, do whatever is necessary, including taking a step, to avoid a fall. Left           Right   (2) recovers independently with 1 step (crossover or lateral OK)   (1) several steps to recover equilibrium   (0) falls or cannot step Score:   2  (use the side with the lowest score)  SENSORY ORIENTATION 7. STANCE FEET TOGETHER EYES OPEN  firm surface Be as stable and still as possible until I say stop   (2) 30s   (1) < 30 s   (0) unable Score: 2  8. STANCE ON FOAM EYES CLOSED feet together, eyes closed, hands on hips. Be as stable and still as you can until I stay stop.  I will start timing when you close your eyes. Time in seconds:      (2) 30s   (1) <30 s   (0) unable Score: 0,  1 on 03/20/24  9. INCLINE EYES CLOSED: Stand on incline with feet shoulder width apart, arms down at your sides.  I will start timing when you close your eyes   (2) Stands 30 s and aligns with gravity   (1) stands < 30 s OR aligns with surface   (0) unable Score: 1, holds at 1 on 03/20/24  DYNAMIC GAIT 10. CHANGE IN GAIT SPEED: begin walking at your normal speed, when I tell you fast, walk as fast as you can, when I say slow walk very slowly   (2) significantly changes walking speed without imbalance   (1) unable to change walking speed or signs of imbalance   (0) unable to achieve significant change in walking speed AND signes of imbalance Score:  2  11. WALK WITH HEAD TURNS HORIZONTAL: begin walking at normal speed, when I say right, turn your head to the right, when I say left turn your head to the left.  Try to keep yourself walking in a straight line   (2) performs head turns with no change in gait speed and good balance   (1) performs head turns with reduction in gait speed   (0)  performs head turns with  imbalance Score: 2  12. WALK WITH PIVOT TURNS: begin walking at your normal speed.  When I tell you to turn and stop, turn as quickly as you can, face the opposite direction and stop.  After the turn, your feet should be close together   (2) turns with feet close FAST in 3 steps with good balance   (1) turns with feet close SLOW > 4 steps with good balance   (0) cannot turn with feet close at any speed without imbalance Score: 2  13. STEP OVER OBSTACLES:  ( 9 inch height 10 feet away from subject) begin walking at your normal speed.  When you get to the box, step over it, not around it and keep walking   (2) Able to step over box with minimal change of gait speed and good balance   (1) Steps over box but touches box OR displays cautious behavior by slowing gait   (0) Unable to step over box OR steps around box Score: 2  14. TIMED UP AND GO NORMAL AND WITH DUAL TASK: 3 METER WALK: When I say go walk at your your normal speed across the tape, turn around and come back to sit in the chair   Time: Count backwards by 3s starting at    20.  When I say go, stand up from the chair, walk at your normal speed across the tape on the floor, turn around, come back to sit in the chair.  Continue counting backwards the entire time.  Time:   (2) No noticeable change in sitting, standing or walking while backward counting when compared to TUG without dual task   (1) Dual task affects either counting OR walking (>10%)    (0) stops counting while walking OR stops walking while counting If gait speed slows more than 10% between TUG without and with dual task, the score should be decreased by a point Score:  not performed on eval  Total score at eval:       17   /26 (# 14 not performed) Total score on 03/20/24: 20/26 (#14 not performed)  TREATMENT DATE:  03/20/24 Reassessment  with objective measures and LEFS updated above Reviewed and updated HEP Discussed plan to d/c for now until cleared to add Lt knee to PT Rx and will do a new assessment for balance and Lt knee when she returns  03/17/24 Sit to stand in staggered stance 5x no hands from high table Right 6 inch forward step ups with left hip flexion 10x (needs moderate single UE support) Right 6 inch  lateral step ups with left hip abduction (needs 1 finger UE support on barre) 10x Foam cushion WB on right with left touch forward and back 10x needs 1 finger support 10x Foam cushion WB on right with lateral movement left toe  touch  1 finger support 10x WB on right with left foot on 8 inch box: holding a 6# dumbbell with trunk rotation 10x, overhead reach 10x WB on right modified RDL 6# to just above 8 inch box 10x (needs 2 finger UE support on barre)  Hip matrix 40# : abduction, extension and flexion 10x each right/left Therapist called Dr. Jeraline office at 1:45 to clarify status of left knee following patellar fracture and request PT referral for left knee (left message with receptionist)  03/12/24 Right 6 inch forward step ups 10x Right 6 inch  lateral step ups 10x WB on right step downs 4 inch heel touch and back toe touch 10x Dynamic balance: circle touches with gradual reduction in UE support until no support last 2 rounds Sit to stand with left foot on box (to limit WB on left LE) 2 x5 WB on right modified RDL 5# to tall cone touch 89k (needs 2 finger UE support on barre)  WB on right, with left foot prop on 6 inch step with head turns, looking up while holding 5# weight unilaterally WB on right with left foot prop on step with 5# weight diagonals 10x right/left Hip matrix 40# left only (WB on right): abduction, extension and flexion 10x each  PATIENT EDUCATION:  Education details: Educated patient on anatomy and physiology of current symptoms, prognosis, plan of care as well as initial self care  strategies to promote recovery Person educated: Patient Education method: Explanation Education comprehension: verbalized understanding  HOME EXERCISE PROGRAM: Access Code: [email or web address] URL: [email or web address] Date: 03/20/2024 Prepared by: Orvil Hardie Veltre  Exercises - Standing Hip Abduction with Resistance at Ankles and Counter Support  - 2 x daily - 7 x weekly - 1 sets - 10 reps - Standing Hip Extension with Resistance at Ankles and Counter Support  - 2 x daily - 7 x weekly - 1 sets - 10 reps - Step Up  - 1 x daily - 7 x weekly - 1 sets - 10 reps - Forward Step Down  - 1 x daily - 7 x weekly - 1 sets - 10 reps - Lateral Step Up  - 1 x daily - 7 x weekly - 1 sets - 10 reps - Staggered Sit-to-Stand  - 1 x daily - 7 x weekly - 1 sets - 10 reps ASSESSMENT:  CLINICAL IMPRESSION: [name] reports 90% improvement in Rt knee.  PT has been modified to protect Lt patella while she heals from a fall related fracture.  She improved mini-BEST score today to a 20 which met LTG.  She is able to perform sit to stand and stand to sit with good knee alignment if she is focused, but can fall into bil knee valgus if not concentrating.  She continues to avoid  squatting but rather uses hip hinge to reach for floor.  Knee ext has improved to -3.  She continues to have some weakness in Rt hip abd and ER but has therex to target this in her HEP.  We updated her HEP today and d/c'd PT for Rt knee for now with understanding that she needs to follow up with MD about Lt patellar fracture before returning to PT.  Treating PT spoke with Dr. Jeraline office today and they understand that we are happy to see [name] back for PT for Lt knee, Rt knee as needed, and more balance progression once she is cleared.    Eval: Patient is a 88 y.o. female who was seen today for physical therapy evaluation and treatment for right knee pain and balance impairment > 1 year s/p TKR.  [name]'s ROM is much improved, particularly with  extension, since she finished PT in September. Current right knee ROM 5-137 degrees.   Strength is good overall although weakness in right glutes and quads affect her ability to rise from a chair without compensations (hip adduction/knee internal rotation) and rise from a kneeling position at church. Mini-BEST test indicates impairments noted with eyes closed, single leg standing and reactive postural control balance and therefore at risk for falls.  Her pain is mild at the moment and mostly at night time.  She would benefit from PT to address these deficits needed for community mobility, upcoming travel and to decrease risk of falls.      OBJECTIVE IMPAIRMENTS: decreased activity tolerance, decreased balance, decreased mobility, difficulty walking, decreased ROM, decreased strength, increased edema, impaired perceived functional ability, impaired flexibility, and pain.   ACTIVITY LIMITATIONS: lifting, bending, standing, squatting, sleeping, stairs, transfers, bed mobility, and locomotion level   PARTICIPATION LIMITATIONS: shopping, community activity, and yard work   PERSONAL FACTORS: Good general health positively affect patient's functional outcome.    REHAB POTENTIAL: Good   CLINICAL DECISION MAKING: Stable/uncomplicated   EVALUATION COMPLEXITY: Low GOALS: Goals reviewed with patient? Yes  SHORT TERM GOALS: Target date: 02/19/2024   The patient will demonstrate knowledge of basic self care strategies and exercises to promote healing  Baseline: Goal status: met 8/19   2.  Able to single leg balance for 3-5 sec on right needed for ascending/descending curbs while traveling to [facility]  Baseline:  Goal status: met 8/19   3.  Patient will be able to rise from a standard height chair with knees in alignment/ without compensatory strategies in order to decrease pain in both knees Baseline:  Goal status: met 8/26 - on 2nd and 3rd trials when focused on knee alignment    LONG TERM  GOALS: Target date: 03/18/2024   The patient will be independent in a safe self progression of a home exercise program to promote further recovery of function and relieve pain Baseline:  Goal status: met for current level of HEP, 03/20/24   2.  Patient will report a 50% improvement in rising from a kneeling position at church Baseline:  Goal status: DEFERRED secondary to Lt patellar fracture after initial eval   3.  Improved balance with Mini-BEST balance score improved to at least 20 indicating a decreased risk of falls  Baseline:  Goal status: MET 03/20/24   4.  Improved strength needed for ascending/descending steps while traveling  Baseline:  Goal status: MET 03/20/24   5.  LE strength needed to meet her personal goal of running across the fairway  Baseline:  Goal status: DEFERRED  secondary to Lt patellar fracture after initial eval   PLAN:  PT FREQUENCY: 2x/week  PT DURATION: 8 weeks  PLANNED INTERVENTIONS: 97164- PT Re-evaluation, 97110-Therapeutic exercises, 97530- Therapeutic activity, 97112- Neuromuscular re-education, 97535- Self Care, [id number]- Manual therapy, [phone]- Aquatic Therapy, [id number]- Electrical stimulation (unattended), [phone]- Electrical stimulation (manual), 97016- Vasopneumatic device, Patient/Family education, Balance training, Stair training, Taping, Joint mobilization, Cryotherapy, and Moist heat  PLAN FOR NEXT SESSION: d/c PT until cleared from Lt patellar fracture, then will return with new PT Rx   PHYSICAL THERAPY DISCHARGE SUMMARY  Visits from Start of Care: 7  Current functional level related to goals / functional outcomes: See above   Remaining deficits: See above   Education / Equipment: HEP   Patient agrees to discharge. Patient goals were partially met. Patient is being discharged due to a change in medical status. (Lt patellar fracture limiting further progression of PT)  Everlena Mackley, PT 03/20/24 12:31 PM

## 2024-04-02 ENCOUNTER — Encounter: Admitting: Physical Therapy

## 2024-04-08 ENCOUNTER — Encounter: Admitting: Physical Therapy

## 2024-04-10 ENCOUNTER — Encounter: Admitting: Physical Therapy

## 2024-04-15 ENCOUNTER — Encounter: Admitting: Physical Therapy

## 2024-04-17 ENCOUNTER — Encounter: Admitting: Physical Therapy

## 2024-04-22 ENCOUNTER — Encounter: Admitting: Physical Therapy

## 2024-04-24 ENCOUNTER — Encounter: Admitting: Physical Therapy

## 2024-07-03 ENCOUNTER — Other Ambulatory Visit: Payer: Self-pay | Admitting: Family Medicine

## 2024-07-03 DIAGNOSIS — Z1231 Encounter for screening mammogram for malignant neoplasm of breast: Secondary | ICD-10-CM

## 2024-08-03 ENCOUNTER — Ambulatory Visit
Admission: RE | Admit: 2024-08-03 | Discharge: 2024-08-03 | Disposition: A | Source: Ambulatory Visit | Attending: Family Medicine | Admitting: Family Medicine

## 2024-08-03 DIAGNOSIS — Z1231 Encounter for screening mammogram for malignant neoplasm of breast: Secondary | ICD-10-CM
